# Patient Record
Sex: Female | Born: 2000 | Race: Black or African American | Hispanic: No | Marital: Single | Smoking: Never smoker
Health system: Southern US, Community
[De-identification: ages and names within clinical notes are randomized; demographics above are authoritative.]

## PROBLEM LIST (undated history)

## (undated) DIAGNOSIS — J45909 Unspecified asthma, uncomplicated: Secondary | ICD-10-CM

---

## 2020-12-05 ENCOUNTER — Other Ambulatory Visit: Payer: Self-pay

## 2020-12-05 ENCOUNTER — Ambulatory Visit (HOSPITAL_COMMUNITY)
Admission: EM | Admit: 2020-12-05 | Discharge: 2020-12-05 | Disposition: A | Discharge status: Home / Self Care | Payer: PRIVATE HEALTH INSURANCE | Attending: Family Medicine | Admitting: Family Medicine

## 2020-12-05 ENCOUNTER — Encounter (HOSPITAL_COMMUNITY): Payer: Self-pay

## 2020-12-05 DIAGNOSIS — Z113 Encounter for screening for infections with a predominantly sexual mode of transmission: Secondary | ICD-10-CM | POA: Insufficient documentation

## 2020-12-05 DIAGNOSIS — R110 Nausea: Secondary | ICD-10-CM | POA: Insufficient documentation

## 2020-12-05 HISTORY — DX: Unspecified asthma, uncomplicated: J45.909

## 2020-12-05 LAB — HIV ANTIBODY (ROUTINE TESTING W REFLEX): HIV Screen 4th Generation wRfx: NONREACTIVE

## 2020-12-05 LAB — POC URINE PREG, ED: Preg Test, Ur: NEGATIVE

## 2020-12-05 MED ORDER — ONDANSETRON 8 MG PO TBDP: 8.0000 mg | ORAL_TABLET | Freq: Three times a day (TID) | ORAL | 0 refills | Status: DC | PRN

## 2020-12-05 NOTE — ED Provider Notes (Signed)
  [facility]   MRN: [id number] DOB: 2001-07-23  Subjective:   Kara Neal is a 20 y.o. female presenting for 2 to 3-day history of intermittent nausea without vomiting, feeling off.  Patient wants to make sure that she does not have a sexually transmitted infection.  She is sexually active, uses condoms for protection.  Would like to make sure she gets pregnancy test.  Also wants HIV and syphilis testing.  Denies fever, belly pain, pelvic pain, vaginal discharge, urinary symptoms.  No genital rashes.  No current facility-administered medications for this encounter. No current outpatient medications on file.   No Known Allergies  Past Medical History:  Diagnosis Date  . Asthma      History reviewed. No pertinent surgical history.  Family History  Problem Relation Age of Onset  . Asthma Father     Social History   Tobacco Use  . Smoking status: Never Smoker  . Smokeless tobacco: Never Used  Substance Use Topics  . Alcohol use: Never  . Drug use: Never    ROS   Objective:   Vitals: BP 117/70   Pulse 74   Temp 98.3 F (36.8 C)   Resp 18   LMP  (LMP Unknown)   SpO2 100%   Physical Exam Constitutional:      General: She is not in acute distress.    Appearance: Normal appearance. She is well-developed. She is not ill-appearing.  HENT:     Head: Normocephalic and atraumatic.     Nose: Nose normal.     Mouth/Throat:     Mouth: Mucous membranes are moist.     Pharynx: Oropharynx is clear.  Eyes:     General: No scleral icterus.    Extraocular Movements: Extraocular movements intact.     Pupils: Pupils are equal, round, and reactive to light.  Cardiovascular:     Rate and Rhythm: Normal rate.  Pulmonary:     Effort: Pulmonary effort is normal.  Skin:    General: Skin is warm and dry.  Neurological:     General: No focal deficit present.     Mental Status: She is alert and oriented to person, place, and time.  Psychiatric:         Mood and Affect: Mood normal.        Behavior: Behavior normal.     Results for orders placed or performed during the hospital encounter of 12/05/20 (from the past 24 hour(s))  POC urine preg, ED (not at [facility])     Status: None   Collection Time: 12/05/20  3:18 PM  Result Value Ref Range   Preg Test, Ur NEGATIVE NEGATIVE    Assessment and Plan :   PDMP not reviewed this encounter.  1. Nausea without vomiting   2. Screen for STD (sexually transmitted disease)     Recommended supportive care with Zofran.  Encouraged patient to continue safe sex practices.  STI testing pending, will treat as appropriate. Counseled patient on potential for adverse effects with medications prescribed/recommended today, ER and return-to-clinic precautions discussed, patient verbalized understanding.    Wallis Bamberg, [place] 12/05/20 1554

## 2020-12-05 NOTE — ED Triage Notes (Signed)
Pt in requesting routine std testing and pregnancy test.  Pt c/o nausea.  Denies vomiting, vaginal discharge or irritation

## 2020-12-06 LAB — CERVICOVAGINAL ANCILLARY ONLY
Chlamydia: NEGATIVE
Comment: NEGATIVE
Comment: NEGATIVE
Comment: NORMAL
Neisseria Gonorrhea: NEGATIVE
Trichomonas: NEGATIVE

## 2020-12-06 LAB — RPR: RPR Ser Ql: NONREACTIVE

## 2021-02-23 ENCOUNTER — Encounter (HOSPITAL_COMMUNITY): Payer: Self-pay | Admitting: *Deleted

## 2021-02-23 ENCOUNTER — Other Ambulatory Visit: Payer: Self-pay

## 2021-02-23 ENCOUNTER — Ambulatory Visit (HOSPITAL_COMMUNITY)
Admission: EM | Admit: 2021-02-23 | Discharge: 2021-02-23 | Disposition: A | Discharge status: Home / Self Care | Payer: PRIVATE HEALTH INSURANCE | Attending: Internal Medicine | Admitting: Internal Medicine

## 2021-02-23 DIAGNOSIS — Z113 Encounter for screening for infections with a predominantly sexual mode of transmission: Secondary | ICD-10-CM

## 2021-02-23 DIAGNOSIS — N898 Other specified noninflammatory disorders of vagina: Secondary | ICD-10-CM | POA: Insufficient documentation

## 2021-02-23 DIAGNOSIS — Z3202 Encounter for pregnancy test, result negative: Secondary | ICD-10-CM | POA: Diagnosis not present

## 2021-02-23 LAB — POCT URINALYSIS DIPSTICK, ED / UC
Bilirubin Urine: NEGATIVE
Glucose, UA: NEGATIVE mg/dL
Hgb urine dipstick: NEGATIVE
Ketones, ur: NEGATIVE mg/dL
Leukocytes,Ua: NEGATIVE
Nitrite: NEGATIVE
Protein, ur: NEGATIVE mg/dL
Specific Gravity, Urine: 1.02 (ref 1.005–1.030)
Urobilinogen, UA: 0.2 mg/dL (ref 0.0–1.0)
pH: 7 (ref 5.0–8.0)

## 2021-02-23 LAB — POC URINE PREG, ED: Preg Test, Ur: NEGATIVE

## 2021-02-23 NOTE — Discharge Instructions (Addendum)
Your pregnancy test was negative.  Your urine did not show any signs of infection.   We will contact you with the results from your lab work and any additional treatment.    Do not have sex while taking undergoing treatment for STI.  Make sure that all of your partners get tested and treated.   Use a condom or other barrier method for all sexual encounters.    Return or go to the Emergency Department if symptoms worsen or do not improve in the next few days.

## 2021-02-23 NOTE — ED Triage Notes (Signed)
Pt reports Sx's started 2 weeks ago ,ABD pain , vag discharge.

## 2021-02-23 NOTE — ED Provider Notes (Signed)
[facility]    CSN: [id number] Arrival date & time: 02/23/21  1314      History   Chief Complaint Chief Complaint  Patient presents with  . Possible Pregnancy  . Exposure to STD    HPI Kara Neal is a 20 y.o. female.   Patient is here for evaluation of vaginal discharge, cramping, and missed period.  Reports developed some discharge and cramping approximately 2 weeks ago.  Denies any known exposure to STIs.  Denies any odor or vaginal itching.  Reports discharge is white.  Has not taken any OTC medications or treatments.  Denies any dysuria, urgency, frequency.  Denies any vaginal or pelvic pain. Denies any specific alleviating or aggravating factors.  Denies any fevers, chest pain, shortness of breath, N/V/D, numbness, tingling, weakness, abdominal pain, or headaches.   ROS: As per HPI, all other pertinent ROS negative   The history is provided by the patient.  Possible Pregnancy  Exposure to STD    Past Medical History:  Diagnosis Date  . Asthma     There are no problems to display for this patient.   History reviewed. No pertinent surgical history.  OB History   No obstetric history on file.      Home Medications    Prior to Admission medications   Medication Sig Start Date End Date Taking? Authorizing Provider  ondansetron (ZOFRAN-ODT) 8 MG disintegrating tablet Take 1 tablet (8 mg total) by mouth every 8 (eight) hours as needed for nausea or vomiting. 12/05/20   Wallis Bamberg, PA-C    Family History Family History  Problem Relation Age of Onset  . Asthma Father     Social History Social History   Tobacco Use  . Smoking status: Never Smoker  . Smokeless tobacco: Never Used  Substance Use Topics  . Alcohol use: Never  . Drug use: Never     Allergies   Patient has no known allergies.   Review of Systems Review of Systems  Genitourinary: Positive for menstrual problem and vaginal discharge. Negative for pelvic pain and vaginal  pain.     Physical Exam Triage Vital Signs ED Triage Vitals  Enc Vitals Group     BP 02/23/21 1402 110/60     Pulse Rate 02/23/21 1402 72     Resp 02/23/21 1402 16     Temp 02/23/21 1402 98.2 F (36.8 C)     Temp Source 02/23/21 1402 Oral     SpO2 02/23/21 1402 99 %     Weight --      Height --      Head Circumference --      Peak Flow --      Pain Score 02/23/21 1357 4     Pain Loc --      Pain Edu? --      Excl. in GC? --    No data found.  Updated Vital Signs BP 110/60 (BP Location: Left Arm)   Pulse 72   Temp 98.2 F (36.8 C) (Oral)   Resp 16   LMP 01/23/2021   SpO2 99%   Visual Acuity Right Eye Distance:   Left Eye Distance:   Bilateral Distance:    Right Eye Near:   Left Eye Near:    Bilateral Near:     Physical Exam Vitals and nursing note reviewed.  Constitutional:      General: She is not in acute distress.    Appearance: Normal appearance. She is not ill-appearing, toxic-appearing  or diaphoretic.  HENT:     Head: Normocephalic and atraumatic.  Eyes:     Conjunctiva/sclera: Conjunctivae normal.  Cardiovascular:     Rate and Rhythm: Normal rate.     Pulses: Normal pulses.  Pulmonary:     Effort: Pulmonary effort is normal.  Abdominal:     General: Abdomen is flat.  Genitourinary:    Comments: declines Musculoskeletal:        General: Normal range of motion.     Cervical back: Normal range of motion.  Skin:    General: Skin is warm and dry.  Neurological:     General: No focal deficit present.     Mental Status: She is alert and oriented to person, place, and time.  Psychiatric:        Mood and Affect: Mood normal.      UC Treatments / Results  Labs (all labs ordered are listed, but only abnormal results are displayed) Labs Reviewed  POCT URINALYSIS DIPSTICK, ED / UC  POC URINE PREG, ED  CERVICOVAGINAL ANCILLARY ONLY    EKG   Radiology No results found.  Procedures Procedures (including critical care  time)  Medications Ordered in UC Medications - No data to display  Initial Impression / Assessment and Plan / UC Course  I have reviewed the triage vital signs and the nursing notes.  Pertinent labs & imaging results that were available during my care of the patient were reviewed by me and considered in my medical decision making (see chart for details).     Assessment negative for red flags or concerns. Self swab obtained.  Will treat based on results. Urinalysis negative for signs of infection, urine pregnancy negative. Safe sex practices discussed including condoms or other barrier methods. Follow-up with primary care as needed  Final Clinical Impressions(s) / UC Diagnoses   Final diagnoses:  Screen for STD (sexually transmitted disease)  Vaginal discharge  Negative pregnancy test     Discharge Instructions     Your pregnancy test was negative.  Your urine did not show any signs of infection.   We will contact you with the results from your lab work and any additional treatment.    Do not have sex while taking undergoing treatment for STI.  Make sure that all of your partners get tested and treated.   Use a condom or other barrier method for all sexual encounters.    Return or go to the Emergency Department if symptoms worsen or do not improve in the next few days.     ED Prescriptions    None     PDMP not reviewed this encounter.   Ivette Loyal, NP 02/23/21 1429

## 2021-02-24 ENCOUNTER — Telehealth (HOSPITAL_COMMUNITY): Payer: Self-pay | Admitting: Emergency Medicine

## 2021-02-24 LAB — CERVICOVAGINAL ANCILLARY ONLY
Bacterial Vaginitis (gardnerella): POSITIVE — AB
Candida Glabrata: NEGATIVE
Candida Vaginitis: NEGATIVE
Chlamydia: NEGATIVE
Comment: NEGATIVE
Comment: NEGATIVE
Comment: NEGATIVE
Comment: NEGATIVE
Comment: NEGATIVE
Comment: NORMAL
Neisseria Gonorrhea: NEGATIVE
Trichomonas: NEGATIVE

## 2021-02-24 MED ORDER — METRONIDAZOLE 500 MG PO TABS: 500.0000 mg | ORAL_TABLET | Freq: Two times a day (BID) | ORAL | 0 refills | Status: DC

## 2021-04-27 ENCOUNTER — Other Ambulatory Visit: Payer: Self-pay

## 2021-04-27 ENCOUNTER — Emergency Department (HOSPITAL_COMMUNITY)
Admission: EM | Admit: 2021-04-27 | Discharge: 2021-04-27 | Disposition: A | Discharge status: Home / Self Care | Payer: PRIVATE HEALTH INSURANCE | Attending: Emergency Medicine | Admitting: Emergency Medicine

## 2021-04-27 ENCOUNTER — Encounter (HOSPITAL_COMMUNITY): Payer: Self-pay | Admitting: Emergency Medicine

## 2021-04-27 ENCOUNTER — Emergency Department (HOSPITAL_COMMUNITY): Payer: PRIVATE HEALTH INSURANCE

## 2021-04-27 DIAGNOSIS — R059 Cough, unspecified: Secondary | ICD-10-CM | POA: Diagnosis present

## 2021-04-27 DIAGNOSIS — J4521 Mild intermittent asthma with (acute) exacerbation: Secondary | ICD-10-CM | POA: Diagnosis not present

## 2021-04-27 DIAGNOSIS — B9689 Other specified bacterial agents as the cause of diseases classified elsewhere: Secondary | ICD-10-CM

## 2021-04-27 DIAGNOSIS — Z113 Encounter for screening for infections with a predominantly sexual mode of transmission: Secondary | ICD-10-CM | POA: Diagnosis not present

## 2021-04-27 DIAGNOSIS — N760 Acute vaginitis: Secondary | ICD-10-CM

## 2021-04-27 DIAGNOSIS — Z20822 Contact with and (suspected) exposure to covid-19: Secondary | ICD-10-CM | POA: Diagnosis not present

## 2021-04-27 DIAGNOSIS — R079 Chest pain, unspecified: Secondary | ICD-10-CM

## 2021-04-27 DIAGNOSIS — B373 Candidiasis of vulva and vagina: Secondary | ICD-10-CM

## 2021-04-27 DIAGNOSIS — M25571 Pain in right ankle and joints of right foot: Secondary | ICD-10-CM | POA: Insufficient documentation

## 2021-04-27 DIAGNOSIS — G8929 Other chronic pain: Secondary | ICD-10-CM | POA: Diagnosis not present

## 2021-04-27 DIAGNOSIS — B3731 Acute candidiasis of vulva and vagina: Secondary | ICD-10-CM

## 2021-04-27 LAB — CBC WITH DIFFERENTIAL/PLATELET
Abs Immature Granulocytes: 0.05 10*3/uL (ref 0.00–0.07)
Basophils Absolute: 0.1 10*3/uL (ref 0.0–0.1)
Basophils Relative: 1 %
Eosinophils Absolute: 0.2 10*3/uL (ref 0.0–0.5)
Eosinophils Relative: 2 %
HCT: 39.9 % (ref 36.0–46.0)
Hemoglobin: 13.1 g/dL (ref 12.0–15.0)
Immature Granulocytes: 1 %
Lymphocytes Relative: 35 %
Lymphs Abs: 3.4 10*3/uL (ref 0.7–4.0)
MCH: 31 pg (ref 26.0–34.0)
MCHC: 32.8 g/dL (ref 30.0–36.0)
MCV: 94.3 fL (ref 80.0–100.0)
Monocytes Absolute: 0.7 10*3/uL (ref 0.1–1.0)
Monocytes Relative: 7 %
Neutro Abs: 5.6 10*3/uL (ref 1.7–7.7)
Neutrophils Relative %: 54 %
Platelets: 289 10*3/uL (ref 150–400)
RBC: 4.23 MIL/uL (ref 3.87–5.11)
RDW: 12.1 % (ref 11.5–15.5)
WBC: 10 10*3/uL (ref 4.0–10.5)
nRBC: 0 % (ref 0.0–0.2)

## 2021-04-27 LAB — BASIC METABOLIC PANEL
Anion gap: 7 (ref 5–15)
BUN: 6 mg/dL (ref 6–20)
CO2: 25 mmol/L (ref 22–32)
Calcium: 8.9 mg/dL (ref 8.9–10.3)
Chloride: 107 mmol/L (ref 98–111)
Creatinine, Ser: 0.59 mg/dL (ref 0.44–1.00)
GFR, Estimated: >60 mL/min (ref >60)
Glucose, Bld: 98 mg/dL (ref 70–99)
Potassium: 3.3 mmol/L — ABNORMAL LOW (ref 3.5–5.1)
Sodium: 139 mmol/L (ref 135–145)

## 2021-04-27 LAB — RESP PANEL BY RT-PCR (FLU A&B, COVID) ARPGX2
Influenza A by PCR: NEGATIVE
Influenza B by PCR: NEGATIVE
SARS Coronavirus 2 by RT PCR: NEGATIVE

## 2021-04-27 LAB — WET PREP, GENITAL
Sperm: NONE SEEN
Trich, Wet Prep: NONE SEEN

## 2021-04-27 LAB — TROPONIN I (HIGH SENSITIVITY): Troponin I (High Sensitivity): <2 ng/L (ref <18)

## 2021-04-27 LAB — I-STAT BETA HCG BLOOD, ED (MC, WL, AP ONLY): I-stat hCG, quantitative: <5 m[IU]/mL (ref <5)

## 2021-04-27 MED ORDER — LIDOCAINE HCL (PF) 1 % IJ SOLN
INTRAMUSCULAR | Status: AC
  Administered 2021-04-27: 1 mL
  Filled 2021-04-27: qty 5

## 2021-04-27 MED ORDER — AZITHROMYCIN 250 MG PO TABS
1000.0000 mg | ORAL_TABLET | Freq: Once | ORAL | Status: AC
  Administered 2021-04-27: 1000 mg via ORAL
  Filled 2021-04-27: qty 4

## 2021-04-27 MED ORDER — CEFTRIAXONE SODIUM 500 MG IJ SOLR
500.0000 mg | Freq: Once | INTRAMUSCULAR | Status: AC
  Administered 2021-04-27: 500 mg via INTRAMUSCULAR
  Filled 2021-04-27: qty 500

## 2021-04-27 MED ORDER — METRONIDAZOLE 500 MG PO TABS: 500.0000 mg | ORAL_TABLET | Freq: Two times a day (BID) | ORAL | 0 refills | Status: DC

## 2021-04-27 MED ORDER — FLUCONAZOLE 200 MG PO TABS: 200.0000 mg | ORAL_TABLET | Freq: Every day | ORAL | 0 refills | Status: AC

## 2021-04-27 NOTE — Discharge Instructions (Signed)
You had a mild asthma flare today - symptoms resolved.  For the next 2 days, use your neb machine morning and night and inhalers every 6 hours.  Start taking an allergy medicine daily. You can try over the counter delsym to help with cough.  Return for worsening symptoms, chest pain, shortness of breath, fever, productive cough  X-ray of right ankle was normal.  Ice. Use ibuprofen or acetaminophen. You can purchase an over the counter ankle brace for support. Return for joint swelling, redness, warmth, fevers, calf pain or swelling.   STD testing is pending - check Mychart for results.  You were treated for possible gonorrhea and chlamydia given possible exposure.  Return for abdominal or pelvic pain, worsening discharge, fevers. Go to the health department for future STD testing

## 2021-04-27 NOTE — ED Provider Notes (Signed)
[facility] EMERGENCY DEPARTMENT Provider Note   CSN: [id number] Arrival date & time: 04/27/21  3295     History Chief Complaint  Patient presents with  . Asthma  . Ankle Pain    Kara Neal is a 20 y.o. female presents to ED from work by EMS for evaluation of "asthma". Also reports right ankle pain. Also requesting STD testing. Patient is on the phone having an argument during encounter.   Reports 2-3 days of a slight dry cough. Thought it was allergies.  She went to work and developed chest tightness, wheezing, worsening cough. Symptoms are similar to previous flares of asthma. She received albuterol x 1, duoneb x 1 and 125 mg solumedrol by EMS and reports significant improvement. Denies fever, leg swelling, calf pain, history of clots.  She uses albuterol and advair inhalers, nebulizer treatments at home.  Denies runny nose, sore throat.   Reports 1 month of "pressure" type pain on the outside of her right ankle and bottom of her foot.  Worse with walking. No injury. Denies swelling, redness, warmth, distal tingling or numbness, calf pain or swelling.   Also requesting STD testing.  Denies abdominal or pelvic pain. Usual vaginal discharge unchanged. No genital lesions. She is concerned about exposure.  HPI     Past Medical History:  Diagnosis Date  . Asthma     There are no problems to display for this patient.   History reviewed. No pertinent surgical history.   OB History   No obstetric history on file.     Family History  Problem Relation Age of Onset  . Asthma Father     Social History   Tobacco Use  . Smoking status: Never Smoker  . Smokeless tobacco: Never Used  Substance Use Topics  . Alcohol use: Never  . Drug use: Never    Home Medications Prior to Admission medications   Medication Sig Start Date End Date Taking? Authorizing Provider  metroNIDAZOLE (FLAGYL) 500 MG tablet Take 1 tablet (500 mg total) by mouth 2 (two) times  daily. Patient not taking: No sig reported 02/24/21   Merrilee Jansky, MD  ondansetron (ZOFRAN-ODT) 8 MG disintegrating tablet Take 1 tablet (8 mg total) by mouth every 8 (eight) hours as needed for nausea or vomiting. Patient not taking: No sig reported 12/05/20   Wallis Bamberg, PA-C    Allergies    Patient has no known allergies.  Review of Systems   Review of Systems  Respiratory: Positive for cough, chest tightness, shortness of breath and wheezing.   Musculoskeletal: Positive for arthralgias.  All other systems reviewed and are negative.   Physical Exam Updated Vital Signs BP 116/79   Pulse 86   Temp (!) 97.5 F (36.4 C) (Oral)   Resp (!) 22   LMP 04/01/2021   SpO2 100%   Physical Exam Vitals and nursing note reviewed.  Constitutional:      General: She is not in acute distress.    Appearance: She is well-developed.     Comments: NAD.  HENT:     Head: Normocephalic and atraumatic.     Right Ear: External ear normal.     Left Ear: External ear normal.     Nose: Nose normal.  Eyes:     General: No scleral icterus.    Conjunctiva/sclera: Conjunctivae normal.  Cardiovascular:     Rate and Rhythm: Normal rate and regular rhythm.     Heart sounds: Normal heart sounds. No murmur  heard.     Comments: No LE edema. No calf tenderness  Pulmonary:     Effort: Pulmonary effort is normal.     Breath sounds: Normal breath sounds. No wheezing.     Comments: Faint expiratory wheezing upper lobes. Normal work of breathing. SpO2 100% during encounter  Abdominal:     Palpations: Abdomen is soft.     Tenderness: There is no abdominal tenderness.  Musculoskeletal:        General: No deformity. Normal range of motion.     Cervical back: Normal range of motion and neck supple.     Comments: No right ankle, foot, calf edema. TTP right ATFL. +Talar tilt. No TTP at medial ankle, calcaneous, achilles.  Normal skin without erythema, warmth, edema. 1+ DP and PT pulses.  Sensation and  strength in right lower extremity intact.   Skin:    General: Skin is warm and dry.     Capillary Refill: Capillary refill takes less than 2 seconds.  Neurological:     Mental Status: She is alert and oriented to person, place, and time.  Psychiatric:        Behavior: Behavior normal.        Thought Content: Thought content normal.        Judgment: Judgment normal.     ED Results / Procedures / Treatments   Labs (all labs ordered are listed, but only abnormal results are displayed) Labs Reviewed  BASIC METABOLIC PANEL - Abnormal; Notable for the following components:      Result Value   Potassium 3.3 (*)    All other components within normal limits  RESP PANEL BY RT-PCR (FLU A&B, COVID) ARPGX2  WET PREP, GENITAL  CBC WITH DIFFERENTIAL/PLATELET  I-STAT BETA HCG BLOOD, ED ([facility], [facility], [facility] ONLY)  GC/CHLAMYDIA PROBE AMP ([place]) NOT AT [facility] [place]  TROPONIN I (HIGH SENSITIVITY)    EKG EKG Interpretation  Date/Time:  Sunday April 27 2021 08:44:13 EDT Ventricular Rate:  78 PR Interval:  136 QRS Duration: 78 QT Interval:  360 QTC Calculation: 410 R Axis:   73 Text Interpretation: Normal sinus rhythm Normal ECG Confirmed by Kristine Royal [phone]) on 04/27/2021 12:29:32 PM   Radiology DG Chest 1 View  Result Date: 04/27/2021 CLINICAL DATA:  Chest pain EXAM: CHEST  1 VIEW COMPARISON:  None. FINDINGS: Lungs are clear. Heart size and pulmonary vascularity are normal. No adenopathy. No pneumothorax. No bone lesions. IMPRESSION: Lungs clear.  Cardiac silhouette normal. Electronically Signed   By: Bretta Bang III M.D.   On: 04/27/2021 09:28   DG Ankle Complete Right  Result Date: 04/27/2021 CLINICAL DATA:  Pain EXAM: RIGHT ANKLE - COMPLETE 3+ VIEW COMPARISON:  None. FINDINGS: Frontal, oblique, and lateral views were obtained. There is no evident fracture or joint effusion. Joint spaces appear normal. Small spur along the dorsal proximal navicular. No erosions. Ankle mortise appears  intact. IMPRESSION: No evident fracture or arthropathy.  Ankle mortise appears intact. Electronically Signed   By: Bretta Bang III M.D.   On: 04/27/2021 09:28    Procedures Procedures   Medications Ordered in ED Medications  cefTRIAXone (ROCEPHIN) injection 500 mg (500 mg Intramuscular Given 04/27/21 1237)  azithromycin (ZITHROMAX) tablet 1,000 mg (1,000 mg Oral Given 04/27/21 1236)  lidocaine (PF) (XYLOCAINE) 1 % injection (1 mL  Given 04/27/21 1238)    ED Course  I have reviewed the triage vital signs and the nursing notes.  Pertinent labs & imaging results that were available during  my care of the patient were reviewed by me and considered in my medical decision making (see chart for details).    MDM Rules/Calculators/A&P                           20 y.o. yo female presents to the ED for several complaints including chest pain, shortness of breath, cough, wheezing consistent with usual asthma flares. Right ankle pain for 1 month - requesting x-ray. STD screening, asymptomatic.  Additional information obtained from chart, nursing and triage notes review  Chart review reveals - 2 previous ED visits for STD screening.  Ordered lab, imaging were personally reviewed and interpreted  Labs reveal - unremarkable. Trop x 1 undetectable. RVP negative. GC/chlamydia probe pending. Wet prep without BV or yeast or trich.   Imaging reveals - CXR without acute findigns. Right ankle x-ray unremarkable. EKG without ischemic changes, RV strain pattern.    Medicines ordered - empiric treatment for STD given exposure - azithromycin, rocephin.   ED course & MDM 1230: I triaged and also evaluated patient in main ED. Lung exam x 2 shows faint wheezing. Normal SpO2 on room air. Patient feels better after breathing treatment, solumedrol by EMS.  Given reassuring work up, clinical response, suspect URI vs seasonal changes causing mild asthma flare.  Will recommend scheduled breathing treatments at  home, antihistamine, antitussive.  She had large dose of steroids IV I think we can defer prednisone taper. No indication for antibiotics.    Suspect soft tissue/overuse injury of right ankle. No symptoms to suggest cellulitis, septic arthritis, gout, DVT. Will recommend RICE, NSAID PCP follow up.   Asymptomatic STD screening requested. Since she has no symptoms, abdominal or pelvic pain, fever, she self swabbed. I think it is reasonable to defer pelvic and bimanual exam.  Empirically treated with rocephin/azithro given possible exposure. Recommended HD for future STD screening. She has no symptoms of pelvic infection, PID, TOA. Patient didn't want to wait for wet prep and requested discharge. Gc/chlamydia and wet prep pending.   Portions of this note were generated with Scientist, clinical (histocompatibility and immunogenetics). Dictation errors may occur despite best attempts at proofreading    Final Clinical Impression(s) / ED Diagnoses Final diagnoses:  Chronic pain of right ankle  Mild intermittent asthma with exacerbation  Screening for STD (sexually transmitted disease)    Rx / DC Orders ED Discharge Orders    None       Jerrell Mylar 04/27/21 1243    Wynetta Fines, MD 04/27/21 1942

## 2021-04-27 NOTE — ED Triage Notes (Signed)
Pt to triage via [facility] from work.  Pt woke up this morning with asthma flare-up.  Doesn't have a rescue inhaler.  EMS administered Albuterol x 1, DuoNeb x 1, and Solumedrol 125 mg. IV-20 g LAC.  Pt states she is feeling better.  Lung sounds improved after treatments.  Still c/o chest tightness to center of chest.  Also requesting R ankle to be x-rayed.  R ankle pain x 1 month with no known injury.

## 2021-04-27 NOTE — ED Notes (Signed)
RN reviewed discharge instructions w/ pt. Follow up, use of nebs, inhalers and allergy meds reviewed. Pain management reviewed. Pt had no further questions

## 2021-04-27 NOTE — ED Provider Notes (Signed)
1315: Wet prep shows yeast and clue cells. Rx sent to pharmacy on record. Patient has left prior to wet prep results.    Liberty Handy, PA-C 04/27/21 1312    Wynetta Fines, MD 04/27/21 [phone]

## 2021-04-27 NOTE — ED Provider Notes (Signed)
Emergency Medicine Provider Triage Evaluation Note  Kara Neal , a 20 y.o. female  was evaluated in triage.  Pt complains of CP central described as tight sharp onset at work with wheezing, coughing, SOB. History of asthma. Received meds by EMS feels a little better. Also wants x-ray of right ankle bc having pain for 1 month. No injury.  No leg swelling or calf pain.  Review of Systems  Positive: CP SOB wheezing cough ankle pain Negative: Fever leg swelling   Physical Exam  BP 132/81 (BP Location: Right Arm)   Pulse 82   Temp (!) 97.5 F (36.4 C) (Oral)   Resp 16   LMP 04/01/2021   SpO2 100%  Gen:   Awake, no distress   Resp:  Normal effort  MSK:   Moves extremities without difficulty. No ankle edema Cards:  RRR no LE edema or calf tenderness    Medical Decision Making  Medically screening exam initiated at 8:58 AM.  Appropriate orders placed.     Patient made aware this encounter is a triage and screening encounter and no beds are immediately available at this time in the ER.  Patient was informed that the remainder of the evaluation will be completed by another provider.  Patient made aware triage orders have been placed and patient will be placed in the waiting room while work up is initiated and until a room becomes available. Patient encouraged to await a formal ER encounter with a clinician.  Patient made aware that exiting the department prior to formal encounter with an ER clinician and completion of the work-up is considered leaving against medical advice.  At that time there is no guarantee that there are no emergency medical conditions present and patient assumes risks of leaving including worsening condition, permanent disability and death. Patient verbalizes understanding.     Liberty Handy, PA-C 04/27/21 0900    Derwood Kaplan, MD 04/28/21 [phone]

## 2021-04-28 LAB — GC/CHLAMYDIA PROBE AMP (~~LOC~~) NOT AT ARMC
Chlamydia: NEGATIVE
Comment: NEGATIVE
Comment: NORMAL
Neisseria Gonorrhea: NEGATIVE

## 2021-12-24 ENCOUNTER — Ambulatory Visit (HOSPITAL_COMMUNITY)
Admission: EM | Admit: 2021-12-24 | Discharge: 2021-12-24 | Disposition: A | Discharge status: Home / Self Care | Payer: Self-pay | Attending: Emergency Medicine | Admitting: Emergency Medicine

## 2021-12-24 ENCOUNTER — Encounter (HOSPITAL_COMMUNITY): Payer: Self-pay | Admitting: *Deleted

## 2021-12-24 ENCOUNTER — Other Ambulatory Visit: Payer: Self-pay

## 2021-12-24 DIAGNOSIS — N898 Other specified noninflammatory disorders of vagina: Secondary | ICD-10-CM

## 2021-12-24 LAB — POC URINE PREG, ED: Preg Test, Ur: NEGATIVE

## 2021-12-24 LAB — HIV ANTIBODY (ROUTINE TESTING W REFLEX): HIV Screen 4th Generation wRfx: NONREACTIVE

## 2021-12-24 NOTE — ED Provider Notes (Signed)
[facility]    CSN: [id number] Arrival date & time: 12/24/21  1044      History   Chief Complaint Chief Complaint  Patient presents with   Vaginal Discharge    HPI Kara Neal is a 21 y.o. female.  She reports abnormal vaginal discharge for a week.  Describes discharge as watery and yellow.  Reports lower abdominal pain, like a cramping pain on and off for the last week.  Uses the implant for birth control.  Does not use condoms when she has vaginal intercourse.  Because of the implant, patient does not get regular periods; last menstrual period was 2 months ago.   Vaginal Discharge Associated symptoms: no dysuria and no fever    Past Medical History:  Diagnosis Date   Asthma     There are no problems to display for this patient.   History reviewed. No pertinent surgical history.  OB History   No obstetric history on file.      Home Medications    Prior to Admission medications   Medication Sig Start Date End Date Taking? Authorizing Provider  metroNIDAZOLE (FLAGYL) 500 MG tablet Take 1 tablet (500 mg total) by mouth 2 (two) times daily. 04/27/21   Liberty Handy, PA-C  ondansetron (ZOFRAN-ODT) 8 MG disintegrating tablet Take 1 tablet (8 mg total) by mouth every 8 (eight) hours as needed for nausea or vomiting. Patient not taking: No sig reported 12/05/20   Wallis Bamberg, PA-C    Family History Family History  Problem Relation Age of Onset   Asthma Father     Social History Social History   Tobacco Use   Smoking status: Never   Smokeless tobacco: Never  Substance Use Topics   Alcohol use: Never   Drug use: Never     Allergies   Patient has no known allergies.   Review of Systems Review of Systems  Constitutional:  Negative for chills and fever.  Genitourinary:  Positive for vaginal discharge. Negative for dysuria, flank pain and vaginal bleeding.    Physical Exam Triage Vital Signs ED Triage Vitals  Enc Vitals Group     BP  --      Pulse Rate 12/24/21 1156 (P) 84     Resp 12/24/21 1156 (P) 18     Temp 12/24/21 1156 (P) 97.6 F (36.4 C)     Temp src --      SpO2 12/24/21 1156 (P) 97 %     Weight --      Height --      Head Circumference --      Peak Flow --      Pain Score 12/24/21 1152 0     Pain Loc --      Pain Edu? --      Excl. in GC? --    No data found.  Updated Vital Signs Pulse (P) 84    Temp (P) 97.6 F (36.4 C)    Resp (P) 18    LMP 09/23/2021 Comment: PT taking birth control   SpO2 (P) 97%   Visual Acuity Right Eye Distance:   Left Eye Distance:   Bilateral Distance:    Right Eye Near:   Left Eye Near:    Bilateral Near:     Physical Exam Constitutional:      General: She is not in acute distress.    Appearance: Normal appearance. She is not ill-appearing.  Cardiovascular:     Rate and Rhythm: Normal rate  and regular rhythm.  Pulmonary:     Effort: Pulmonary effort is normal.     Breath sounds: Normal breath sounds.  Abdominal:     General: Abdomen is flat. Bowel sounds are normal. There is no distension.     Palpations: Abdomen is soft.     Tenderness: There is generalized abdominal tenderness. There is no right CVA tenderness, guarding or rebound.  Neurological:     Mental Status: She is alert.     UC Treatments / Results  Labs (all labs ordered are listed, but only abnormal results are displayed) Labs Reviewed  HIV ANTIBODY (ROUTINE TESTING W REFLEX)  RPR  POC URINE PREG, ED  CERVICOVAGINAL ANCILLARY ONLY    EKG   Radiology No results found.  Procedures Procedures (including critical care time)  Medications Ordered in UC Medications - No data to display  Initial Impression / Assessment and Plan / UC Course  I have reviewed the triage vital signs and the nursing notes.  Pertinent labs & imaging results that were available during my care of the patient were reviewed by me and considered in my medical decision making (see chart for details).     Pregnancy test negative.  All other tests pending.  Final Clinical Impressions(s) / UC Diagnoses   Final diagnoses:  Vaginal discharge     Discharge Instructions      You will get a call if tests are positive, you will not get a call if tests are negative but you can check results in MyChart if you have a MyChart account.     ED Prescriptions   None    PDMP not reviewed this encounter.   Cathlyn Parsons, NP 12/24/21 1326

## 2021-12-24 NOTE — Discharge Instructions (Signed)
You will get a call if tests are positive, you will not get a call if tests are negative but you can check results in MyChart if you have a MyChart account.   

## 2021-12-24 NOTE — ED Triage Notes (Signed)
Pt reports a vaginal discharge that is not normal.

## 2021-12-25 LAB — CERVICOVAGINAL ANCILLARY ONLY
Bacterial Vaginitis (gardnerella): POSITIVE — AB
Candida Glabrata: NEGATIVE
Candida Vaginitis: NEGATIVE
Chlamydia: NEGATIVE
Comment: NEGATIVE
Comment: NEGATIVE
Comment: NEGATIVE
Comment: NEGATIVE
Comment: NEGATIVE
Comment: NORMAL
Neisseria Gonorrhea: NEGATIVE
Trichomonas: NEGATIVE

## 2021-12-25 LAB — RPR: RPR Ser Ql: NONREACTIVE

## 2021-12-26 ENCOUNTER — Telehealth (HOSPITAL_COMMUNITY): Payer: Self-pay | Admitting: Emergency Medicine

## 2021-12-26 MED ORDER — METRONIDAZOLE 500 MG PO TABS: 500.0000 mg | ORAL_TABLET | Freq: Two times a day (BID) | ORAL | 0 refills | Status: DC

## 2022-02-05 ENCOUNTER — Ambulatory Visit (HOSPITAL_COMMUNITY)
Admission: EM | Admit: 2022-02-05 | Discharge: 2022-02-05 | Disposition: A | Discharge status: Home / Self Care | Payer: Self-pay | Attending: Physician Assistant | Admitting: Physician Assistant

## 2022-02-05 ENCOUNTER — Other Ambulatory Visit: Payer: Self-pay

## 2022-02-05 ENCOUNTER — Encounter (HOSPITAL_COMMUNITY): Payer: Self-pay | Admitting: *Deleted

## 2022-02-05 DIAGNOSIS — J40 Bronchitis, not specified as acute or chronic: Secondary | ICD-10-CM | POA: Insufficient documentation

## 2022-02-05 DIAGNOSIS — N898 Other specified noninflammatory disorders of vagina: Secondary | ICD-10-CM | POA: Insufficient documentation

## 2022-02-05 DIAGNOSIS — J329 Chronic sinusitis, unspecified: Secondary | ICD-10-CM | POA: Insufficient documentation

## 2022-02-05 DIAGNOSIS — J4521 Mild intermittent asthma with (acute) exacerbation: Secondary | ICD-10-CM | POA: Insufficient documentation

## 2022-02-05 MED ORDER — ALBUTEROL SULFATE HFA 108 (90 BASE) MCG/ACT IN AERS: 1.0000 | INHALATION_SPRAY | Freq: Four times a day (QID) | RESPIRATORY_TRACT | 0 refills | Status: DC | PRN

## 2022-02-05 MED ORDER — AMOXICILLIN-POT CLAVULANATE 875-125 MG PO TABS: 1.0000 | ORAL_TABLET | Freq: Two times a day (BID) | ORAL | 0 refills | Status: DC

## 2022-02-05 MED ORDER — PREDNISONE 20 MG PO TABS: 40.0000 mg | ORAL_TABLET | Freq: Every day | ORAL | 0 refills | Status: AC

## 2022-02-05 MED ORDER — ALBUTEROL SULFATE (2.5 MG/3ML) 0.083% IN NEBU: 2.5000 mg | INHALATION_SOLUTION | Freq: Four times a day (QID) | RESPIRATORY_TRACT | 0 refills | Status: DC | PRN

## 2022-02-05 NOTE — Discharge Instructions (Signed)
Start Augmentin twice daily to cover for infection.  Take prednisone 40 mg for 4 days.  Do not take NSAIDs including aspirin, ibuprofen/Advil, naproxen/Aleve with this medication.  Use Mucinex, Tylenol, Flonase for symptom relief.  I have sent in refills of your albuterol so please use these to manage her shortness of breath symptoms.  If symptoms are not improving or if anything worsens you need to return for reevaluation.  If anything becomes severe including chest pain, shortness of breath, worsening cough you should be seen immediately. ? ?We will contact you if we need to arrange any treatment based on your swab result.  Please abstain from sex activity until results are obtained.  If you develop any pelvic pain, abdominal pain, fever, nausea, vomiting please return for reevaluation. ?

## 2022-02-05 NOTE — ED Triage Notes (Signed)
Pt reports Vag discharge. Pt reports she has been hoarse for one week. ?

## 2022-02-05 NOTE — ED Provider Notes (Signed)
?[facility] ? ? ? ?CSN: [id number] ?Arrival date & time: 02/05/22  1220 ? ? ?  ? ?History   ?Chief Complaint ?Chief Complaint  ?Patient presents with  ? SEXUALLY TRANSMITTED DISEASE  ? Hoarse  ? Sore Throat  ? ? ?HPI ?Kara Neal is a 21 y.o. female.  ? ?Patient presents today with several concerns.  She reports a 2-week history of cough with associated sore throat and soreness.  She denies any fever, chest pain, shortness of breath.  She does have a history of asthma and has been using albuterol more frequently; requesting refill of nebulizer solution as well as inhaler to have on hand.  She denies any recent antibiotic or steroid use.  Denies any known sick contacts.  She is up-to-date on age-appropriate immunizations.  She has not been using any over-the-counter medication for symptom management.  Reports symptoms have been worsening prompting evaluation today. ? ?In addition, patient reports a 1 week history of vaginal discharge.  She describes this as copious, malodorous, yellow.  She is sexually active.  Denies any specific exposures.  Declined HIV/hepatitis/syphilis testing.  She was seen and treated for bacterial vaginosis in February 2023 and reports symptoms resolved with metronidazole.  She does report changing her soap but denies additional changes to personal hygiene products.  Denies any recent medication changes.  She is confident that she is not pregnant. ? ? ?Past Medical History:  ?Diagnosis Date  ? Asthma   ? ? ?There are no problems to display for this patient. ? ? ?History reviewed. No pertinent surgical history. ? ?OB History   ?No obstetric history on file. ?  ? ? ? ?Home Medications   ? ?Prior to Admission medications   ?Medication Sig Start Date End Date Taking? Authorizing Provider  ?albuterol (PROVENTIL) (2.5 MG/3ML) 0.083% nebulizer solution Take 3 mLs (2.5 mg total) by nebulization every 6 (six) hours as needed for wheezing or shortness of breath. 02/05/22  Yes Kiylee Thoreson  K, PA-C  ?albuterol (VENTOLIN HFA) 108 (90 Base) MCG/ACT inhaler Inhale 1-2 puffs into the lungs every 6 (six) hours as needed for wheezing or shortness of breath. 02/05/22  Yes Amalia Edgecombe K, PA-C  ?amoxicillin-clavulanate (AUGMENTIN) 875-125 MG tablet Take 1 tablet by mouth every 12 (twelve) hours. 02/05/22  Yes Talea Manges K, PA-C  ?predniSONE (DELTASONE) 20 MG tablet Take 2 tablets (40 mg total) by mouth daily for 4 days. 02/05/22 02/09/22 Yes Lennex Pietila, Noberto Retort, PA-C  ? ? ?Family History ?Family History  ?Problem Relation Age of Onset  ? Asthma Father   ? ? ?Social History ?Social History  ? ?Tobacco Use  ? Smoking status: Never  ? Smokeless tobacco: Never  ?Substance Use Topics  ? Alcohol use: Never  ? Drug use: Never  ? ? ? ?Allergies   ?Patient has no known allergies. ? ? ?Review of Systems ?Review of Systems  ?Constitutional:  Positive for activity change. Negative for appetite change, fatigue and fever.  ?HENT:  Positive for congestion, sore throat and voice change. Negative for sinus pressure and sneezing.   ?Respiratory:  Positive for cough and chest tightness. Negative for shortness of breath.   ?Cardiovascular:  Negative for chest pain.  ?Gastrointestinal:  Negative for abdominal pain, diarrhea, nausea and vomiting.  ?Genitourinary:  Positive for vaginal discharge. Negative for vaginal bleeding and vaginal pain.  ?Neurological:  Negative for dizziness, light-headedness and headaches.  ? ? ?Physical Exam ?Triage Vital Signs ?ED Triage Vitals  ?Enc Vitals Group  ?  BP 02/05/22 1345 108/73  ?   Pulse Rate 02/05/22 1345 95  ?   Resp 02/05/22 1345 16  ?   Temp 02/05/22 1345 98.4 ?F (36.9 ?C)  ?   Temp src --   ?   SpO2 02/05/22 1345 98 %  ?   Weight --   ?   Height --   ?   Head Circumference --   ?   Peak Flow --   ?   Pain Score 02/05/22 1343 6  ?   Pain Loc --   ?   Pain Edu? --   ?   Excl. in GC? --   ? ?No data found. ? ?Updated Vital Signs ?BP 108/73   Pulse 95   Temp 98.4 ?F (36.9 ?C)   Resp 16   LMP  01/08/2022   SpO2 98%  ? ?Visual Acuity ?Right Eye Distance:   ?Left Eye Distance:   ?Bilateral Distance:   ? ?Right Eye Near:   ?Left Eye Near:    ?Bilateral Near:    ? ?Physical Exam ?Vitals reviewed.  ?Constitutional:   ?   General: She is awake. She is not in acute distress. ?   Appearance: Normal appearance. She is well-developed. She is not ill-appearing.  ?   Comments: Very pleasant female appears stated age in no acute distress sitting comfortably in exam room  ?HENT:  ?   Head: Normocephalic and atraumatic.  ?   Right Ear: Tympanic membrane, ear canal and external ear normal. Tympanic membrane is not erythematous or bulging.  ?   Left Ear: Ear canal and external ear normal. There is impacted cerumen.  ?   Nose:  ?   Right Sinus: No maxillary sinus tenderness or frontal sinus tenderness.  ?   Left Sinus: No maxillary sinus tenderness or frontal sinus tenderness.  ?   Mouth/Throat:  ?   Pharynx: Uvula midline. Posterior oropharyngeal erythema present. No oropharyngeal exudate.  ?Cardiovascular:  ?   Rate and Rhythm: Normal rate and regular rhythm.  ?   Heart sounds: Normal heart sounds, S1 normal and S2 normal. No murmur heard. ?Pulmonary:  ?   Effort: Pulmonary effort is normal.  ?   Breath sounds: Wheezing present. No rhonchi or rales.  ?   Comments: Scattered wheezing ?Abdominal:  ?   General: Bowel sounds are normal.  ?   Palpations: Abdomen is soft.  ?   Tenderness: There is no abdominal tenderness.  ?Genitourinary: ?   Comments: Exam deferred ?Psychiatric:     ?   Behavior: Behavior is cooperative.  ? ? ? ?UC Treatments / Results  ?Labs ?(all labs ordered are listed, but only abnormal results are displayed) ?Labs Reviewed  ?CERVICOVAGINAL ANCILLARY ONLY  ? ? ?EKG ? ? ?Radiology ?No results found. ? ?Procedures ?Procedures (including critical care time) ? ?Medications Ordered in UC ?Medications - No data to display ? ?Initial Impression / Assessment and Plan / UC Course  ?I have reviewed the triage  vital signs and the nursing notes. ? ?Pertinent labs & imaging results that were available during my care of the patient were reviewed by me and considered in my medical decision making (see chart for details). ? ?  ? ?No indication for viral testing given patient has been symptomatic for several weeks.  Concerned that illness has caused asthma exacerbation.  Refill of albuterol inhaler and nebulizer solution sent to pharmacy.  We will start prednisone burst.  She was instructed not  to take NSAIDs with this medication due to risk of GI bleeding.  She was started on Augmentin.  Discussed that if she has any worsening symptoms she needs to be reevaluated including fever, chest pain, shortness of breath, nausea/vomiting she needs to be seen immediately.  Strict return precautions given to which she expressed understanding. ? ?We will defer treatment until STI swab results are available.  Recommended hypoallergenic soaps and detergents.  She is to abstain from sexual practices until results are available.  If she develops any worsening symptoms she needs to return for reevaluation.  Strict return precautions given to which she expressed understanding ? ?Final Clinical Impressions(s) / UC Diagnoses  ? ?Final diagnoses:  ?Sinobronchitis  ?Mild intermittent asthma with acute exacerbation  ?Vaginal discharge  ? ? ? ?Discharge Instructions   ? ?  ?Start Augmentin twice daily to cover for infection.  Take prednisone 40 mg for 4 days.  Do not take NSAIDs including aspirin, ibuprofen/Advil, naproxen/Aleve with this medication.  Use Mucinex, Tylenol, Flonase for symptom relief.  I have sent in refills of your albuterol so please use these to manage her shortness of breath symptoms.  If symptoms are not improving or if anything worsens you need to return for reevaluation.  If anything becomes severe including chest pain, shortness of breath, worsening cough you should be seen immediately. ? ?We will contact you if we need to  arrange any treatment based on your swab result.  Please abstain from sex activity until results are obtained.  If you develop any pelvic pain, abdominal pain, fever, nausea, vomiting please return for reevaluatio

## 2022-02-06 ENCOUNTER — Telehealth (HOSPITAL_COMMUNITY): Payer: Self-pay | Admitting: Emergency Medicine

## 2022-02-06 LAB — CERVICOVAGINAL ANCILLARY ONLY
Bacterial Vaginitis (gardnerella): NEGATIVE
Candida Glabrata: NEGATIVE
Candida Vaginitis: POSITIVE — AB
Chlamydia: NEGATIVE
Comment: NEGATIVE
Comment: NEGATIVE
Comment: NEGATIVE
Comment: NEGATIVE
Comment: NEGATIVE
Comment: NORMAL
Neisseria Gonorrhea: NEGATIVE
Trichomonas: NEGATIVE

## 2022-02-06 MED ORDER — FLUCONAZOLE 150 MG PO TABS: 150.0000 mg | ORAL_TABLET | Freq: Once | ORAL | 0 refills | Status: AC

## 2022-02-19 ENCOUNTER — Other Ambulatory Visit: Payer: Self-pay

## 2022-02-19 ENCOUNTER — Ambulatory Visit (INDEPENDENT_AMBULATORY_CARE_PROVIDER_SITE_OTHER): Payer: Self-pay

## 2022-02-19 ENCOUNTER — Encounter: Payer: Self-pay | Admitting: Emergency Medicine

## 2022-02-19 ENCOUNTER — Ambulatory Visit
Admission: EM | Admit: 2022-02-19 | Discharge: 2022-02-19 | Disposition: A | Discharge status: Home / Self Care | Payer: Self-pay | Attending: Internal Medicine | Admitting: Internal Medicine

## 2022-02-19 DIAGNOSIS — J069 Acute upper respiratory infection, unspecified: Secondary | ICD-10-CM

## 2022-02-19 DIAGNOSIS — S161XXA Strain of muscle, fascia and tendon at neck level, initial encounter: Secondary | ICD-10-CM

## 2022-02-19 DIAGNOSIS — R053 Chronic cough: Secondary | ICD-10-CM

## 2022-02-19 MED ORDER — PREDNISONE 10 MG (21) PO TBPK: ORAL_TABLET | Freq: Every day | ORAL | 0 refills | Status: DC

## 2022-02-19 NOTE — ED Provider Notes (Signed)
?[facility] ? ? ? ?CSN: [id number] ?Arrival date & time: 02/19/22  1433 ? ? ?  ? ?History   ?Chief Complaint ?Chief Complaint  ?Patient presents with  ? URI  ? ? ?HPI ?Kara Neal is a 21 y.o. female.  ? ?Patient presents with cough and runny nose that has been present for approximately 1 month.  Patient was seen in a different urgent care on 02/05/2022 and was prescribed albuterol inhaler, Augmentin, prednisone with minimal improvement in symptoms.  Patient does have a history of asthma but denies any chest pain or shortness of breath.  Denies any known fevers as well.  Denies sore throat, ear pain, nausea, vomiting, diarrhea, abdominal pain. Patient also presenting with right-sided neck pain that extends into right upper back that has been present for a few weeks as well.  Denies any apparent injury.  Denies history of chronic neck or back pain.  Patient has taken over-the-counter ibuprofen and Tylenol with minimal improvement in symptoms.  Denies any numbness or tingling.  Movement does not exacerbate pain.  It is a constant pain for patient. ? ? ?URI ? ?Past Medical History:  ?Diagnosis Date  ? Asthma   ? ? ?There are no problems to display for this patient. ? ? ?History reviewed. No pertinent surgical history. ? ?OB History   ?No obstetric history on file. ?  ? ? ? ?Home Medications   ? ?Prior to Admission medications   ?Medication Sig Start Date End Date Taking? Authorizing Provider  ?predniSONE (STERAPRED UNI-PAK 21 TAB) 10 MG (21) TBPK tablet Take by mouth daily. Take 6 tabs by mouth daily  for 2 days, then 5 tabs for 2 days, then 4 tabs for 2 days, then 3 tabs for 2 days, 2 tabs for 2 days, then 1 tab by mouth daily for 2 days 02/19/22  Yes Shylyn Younce, Rolly Salter E, FNP  ?albuterol (PROVENTIL) (2.5 MG/3ML) 0.083% nebulizer solution Take 3 mLs (2.5 mg total) by nebulization every 6 (six) hours as needed for wheezing or shortness of breath. 02/05/22   Raspet, Noberto Retort, PA-C  ?albuterol (VENTOLIN HFA) 108 (90  Base) MCG/ACT inhaler Inhale 1-2 puffs into the lungs every 6 (six) hours as needed for wheezing or shortness of breath. 02/05/22   Raspet, Noberto Retort, PA-C  ?amoxicillin-clavulanate (AUGMENTIN) 875-125 MG tablet Take 1 tablet by mouth every 12 (twelve) hours. ?Patient not taking: Reported on 02/19/2022 02/05/22   Raspet, Noberto Retort, PA-C  ? ? ?Family History ?Family History  ?Problem Relation Age of Onset  ? Asthma Father   ? ? ?Social History ?Social History  ? ?Tobacco Use  ? Smoking status: Never  ? Smokeless tobacco: Never  ?Vaping Use  ? Vaping Use: Never used  ?Substance Use Topics  ? Alcohol use: Never  ? Drug use: Never  ? ? ? ?Allergies   ?Patient has no known allergies. ? ? ?Review of Systems ?Review of Systems ?Per HPI ? ?Physical Exam ?Triage Vital Signs ?ED Triage Vitals  ?Enc Vitals Group  ?   BP 02/19/22 1522 100/66  ?   Pulse Rate 02/19/22 1522 63  ?   Resp 02/19/22 1522 20  ?   Temp 02/19/22 1522 98.4 ?F (36.9 ?C)  ?   Temp Source 02/19/22 1522 Oral  ?   SpO2 02/19/22 1522 96 %  ?   Weight --   ?   Height --   ?   Head Circumference --   ?   Peak Flow --   ?  Pain Score 02/19/22 1520 5  ?   Pain Loc --   ?   Pain Edu? --   ?   Excl. in GC? --   ? ?No data found. ? ?Updated Vital Signs ?BP 100/66 (BP Location: Left Arm)   Pulse 63   Temp 98.4 ?F (36.9 ?C) (Oral)   Resp 20   SpO2 96%  ? ?Visual Acuity ?Right Eye Distance:   ?Left Eye Distance:   ?Bilateral Distance:   ? ?Right Eye Near:   ?Left Eye Near:    ?Bilateral Near:    ? ?Physical Exam ?Constitutional:   ?   General: She is not in acute distress. ?   Appearance: Normal appearance. She is not toxic-appearing or diaphoretic.  ?HENT:  ?   Head: Normocephalic and atraumatic.  ?   Right Ear: Tympanic membrane and ear canal normal.  ?   Left Ear: Tympanic membrane and ear canal normal.  ?   Nose: Congestion present.  ?   Mouth/Throat:  ?   Mouth: Mucous membranes are moist.  ?   Pharynx: No posterior oropharyngeal erythema.  ?Eyes:  ?   Extraocular  Movements: Extraocular movements intact.  ?   Conjunctiva/sclera: Conjunctivae normal.  ?   Pupils: Pupils are equal, round, and reactive to light.  ?Cardiovascular:  ?   Rate and Rhythm: Normal rate and regular rhythm.  ?   Pulses: Normal pulses.  ?   Heart sounds: Normal heart sounds.  ?Pulmonary:  ?   Effort: Pulmonary effort is normal. No respiratory distress.  ?   Breath sounds: Normal breath sounds. No stridor. No wheezing, rhonchi or rales.  ?Abdominal:  ?   General: Abdomen is flat. Bowel sounds are normal.  ?   Palpations: Abdomen is soft.  ?Musculoskeletal:     ?   General: Normal range of motion.  ?   Cervical back: Normal range of motion.  ?   Thoracic back: Normal.  ?   Lumbar back: Normal.  ?     Back: ? ?   Comments: Tenderness to palpation to area circled on diagram. No direct spinal tenderness, crepitus, step off noted. Has full ROM of neck.   ?Skin: ?   General: Skin is warm and dry.  ?Neurological:  ?   General: No focal deficit present.  ?   Mental Status: She is alert and oriented to person, place, and time. Mental status is at baseline.  ?Psychiatric:     ?   Mood and Affect: Mood normal.     ?   Behavior: Behavior normal.  ? ? ? ?UC Treatments / Results  ?Labs ?(all labs ordered are listed, but only abnormal results are displayed) ?Labs Reviewed - No data to display ? ?EKG ? ? ?Radiology ?DG Chest 2 View ? ?Result Date: 02/19/2022 ?CLINICAL DATA:  Cough and upper respiratory infection. EXAM: CHEST - 2 VIEW COMPARISON:  04/27/2021 FINDINGS: The cardiac silhouette, mediastinal and hilar contours are normal. The lungs are clear. No pleural effusions. The bony thorax is intact. IMPRESSION: No acute cardiopulmonary findings. Electronically Signed   By: Rudie MeyerP.  Gallerani M.D.   On: 02/19/2022 16:04   ? ?Procedures ?Procedures (including critical care time) ? ?Medications Ordered in UC ?Medications - No data to display ? ?Initial Impression / Assessment and Plan / UC Course  ?I have reviewed the triage  vital signs and the nursing notes. ? ?Pertinent labs & imaging results that were available during my care of the  patient were reviewed by me and considered in my medical decision making (see chart for details). ? ?  ? ?Chest x-ray was negative for any acute cardiopulmonary process.  Suspect possible bronchitis from persistent cough.  No signs of pneumonia on x-ray.  Do not think that additional antibiotic treatment is necessary at this time.  Neck/back pain appears to be a muscle strain.  Do not think imaging is necessary given no apparent injury and no direct spinal tenderness.  I do think the patient would benefit from another course of steroids as this would be helpful with upper respiratory symptoms, cough, neck and back pain.  Discussed supportive care with patient as well.  Discussed return precautions.  Patient verbalized understanding and was agreeable with plan. ?Final Clinical Impressions(s) / UC Diagnoses  ? ?Final diagnoses:  ?Acute upper respiratory infection  ?Persistent cough for 3 weeks or longer  ?Strain of neck muscle, initial encounter  ? ? ? ?Discharge Instructions   ? ?  ?Your chest x-ray was normal.  You have been prescribed prednisone steroid to help alleviate cough, upper respiratory symptoms, neck/back pain.  Please follow-up if symptoms persist or worsen. ? ? ? ? ?ED Prescriptions   ? ? Medication Sig Dispense Auth. Provider  ? predniSONE (STERAPRED UNI-PAK 21 TAB) 10 MG (21) TBPK tablet Take by mouth daily. Take 6 tabs by mouth daily  for 2 days, then 5 tabs for 2 days, then 4 tabs for 2 days, then 3 tabs for 2 days, 2 tabs for 2 days, then 1 tab by mouth daily for 2 days 42 tablet [place], [place]ie [place], [place]  ? ?  ? ?PDMP not reviewed this encounter. ?  ?Gustavus Bryant, [place] ?02/19/22 1617 ? ?

## 2022-02-19 NOTE — Discharge Instructions (Signed)
Your chest x-ray was normal.  You have been prescribed prednisone steroid to help alleviate cough, upper respiratory symptoms, neck/back pain.  Please follow-up if symptoms persist or worsen. ?

## 2022-02-19 NOTE — ED Triage Notes (Signed)
Cough, runny nose, back pain started 2 weeks ago.  Covid test was negative.  Patient is asking for a work note ?

## 2022-03-23 ENCOUNTER — Ambulatory Visit (HOSPITAL_COMMUNITY): Payer: Self-pay

## 2022-03-28 ENCOUNTER — Encounter (HOSPITAL_COMMUNITY): Payer: Self-pay | Admitting: Emergency Medicine

## 2022-03-28 ENCOUNTER — Ambulatory Visit (HOSPITAL_COMMUNITY)
Admission: EM | Admit: 2022-03-28 | Discharge: 2022-03-28 | Disposition: A | Discharge status: Home / Self Care | Payer: Self-pay | Attending: Physician Assistant | Admitting: Physician Assistant

## 2022-03-28 ENCOUNTER — Other Ambulatory Visit: Payer: Self-pay

## 2022-03-28 DIAGNOSIS — R102 Pelvic and perineal pain: Secondary | ICD-10-CM | POA: Insufficient documentation

## 2022-03-28 DIAGNOSIS — N730 Acute parametritis and pelvic cellulitis: Secondary | ICD-10-CM | POA: Insufficient documentation

## 2022-03-28 LAB — HEPATITIS C ANTIBODY: HCV Ab: NONREACTIVE

## 2022-03-28 LAB — POCT URINALYSIS DIPSTICK, ED / UC
Bilirubin Urine: NEGATIVE
Glucose, UA: NEGATIVE mg/dL
Ketones, ur: NEGATIVE mg/dL
Leukocytes,Ua: NEGATIVE
Nitrite: NEGATIVE
Protein, ur: NEGATIVE mg/dL
Specific Gravity, Urine: 1.02 (ref 1.005–1.030)
Urobilinogen, UA: 0.2 mg/dL (ref 0.0–1.0)
pH: 7 (ref 5.0–8.0)

## 2022-03-28 LAB — HIV ANTIBODY (ROUTINE TESTING W REFLEX): HIV Screen 4th Generation wRfx: NONREACTIVE

## 2022-03-28 LAB — POC URINE PREG, ED: Preg Test, Ur: NEGATIVE

## 2022-03-28 MED ORDER — LIDOCAINE HCL (PF) 1 % IJ SOLN
INTRAMUSCULAR | Status: AC
  Filled 2022-03-28: qty 2

## 2022-03-28 MED ORDER — CEFTRIAXONE SODIUM 500 MG IJ SOLR
INTRAMUSCULAR | Status: AC
  Filled 2022-03-28: qty 500

## 2022-03-28 MED ORDER — ONDANSETRON 4 MG PO TBDP: 4.0000 mg | ORAL_TABLET | Freq: Three times a day (TID) | ORAL | 0 refills | Status: DC | PRN

## 2022-03-28 MED ORDER — CEFTRIAXONE SODIUM 500 MG IJ SOLR
500.0000 mg | Freq: Once | INTRAMUSCULAR | Status: AC
  Administered 2022-03-28: 500 mg via INTRAMUSCULAR

## 2022-03-28 MED ORDER — DOXYCYCLINE HYCLATE 100 MG PO CAPS: 100.0000 mg | ORAL_CAPSULE | Freq: Two times a day (BID) | ORAL | 0 refills | Status: DC

## 2022-03-28 MED ORDER — ONDANSETRON 4 MG PO TBDP
4.0000 mg | ORAL_TABLET | Freq: Once | ORAL | Status: AC
  Administered 2022-03-28: 4 mg via ORAL

## 2022-03-28 MED ORDER — ONDANSETRON 4 MG PO TBDP
ORAL_TABLET | ORAL | Status: AC
  Filled 2022-03-28: qty 1

## 2022-03-28 MED ORDER — METRONIDAZOLE 500 MG PO TABS: 500.0000 mg | ORAL_TABLET | Freq: Two times a day (BID) | ORAL | 0 refills | Status: DC

## 2022-03-28 NOTE — ED Triage Notes (Addendum)
Lower abdominal cramping.  Cramping wakes patient in sleep.  This started 2 weeks ago.  Patient has vaginal discharge.  Patient reports she is having to urinate more frequently, but not painful.  Last bm was, cannot remember.   ? ?Patient states she thinks it is gonorrhea-history of the same ? ?Requesting blood work for std and complains of lump in vagina ?

## 2022-03-28 NOTE — ED Provider Notes (Signed)
?[facility] ? ? ? ?CSN: [id number] ?Arrival date & time: 03/28/22  1318 ? ? ?  ? ?History   ?Chief Complaint ?Chief Complaint  ?Patient presents with  ? SEXUALLY TRANSMITTED DISEASE  ? ? ?HPI ?Kara Neal is a 21 y.o. female.  ? ?Patient presents today with a several week history of lower abdominal pain that has worsened in the past several days.  She does report some vaginal discharge but denies any significant odor.  She is experiencing some pelvic pain.  She denies any fever but has had nausea and vomiting.  She denies history of gastrointestinal disorder.  She denies any urinary symptoms including frequency, urgency, hematuria.  She is interested in complete STI testing.  She is sexually active with female partners.  She has previously been active with men several years ago but consistently use condoms.  She denies any recent antibiotic use.  Denies any changes to personal hygiene products.  She is able to eat and drink despite nausea and vomiting symptoms. ? ? ?Past Medical History:  ?Diagnosis Date  ? Asthma   ? ? ?There are no problems to display for this patient. ? ? ?History reviewed. No pertinent surgical history. ? ?OB History   ?No obstetric history on file. ?  ? ? ? ?Home Medications   ? ?Prior to Admission medications   ?Medication Sig Start Date End Date Taking? Authorizing Provider  ?doxycycline (VIBRAMYCIN) 100 MG capsule Take 1 capsule (100 mg total) by mouth 2 (two) times daily. 03/28/22  Yes Mickle Campton K, PA-C  ?metroNIDAZOLE (FLAGYL) 500 MG tablet Take 1 tablet (500 mg total) by mouth 2 (two) times daily. 03/28/22  Yes Denene Alamillo K, PA-C  ?ondansetron (ZOFRAN-ODT) 4 MG disintegrating tablet Take 1 tablet (4 mg total) by mouth every 8 (eight) hours as needed for nausea or vomiting. 03/28/22  Yes Iman Orourke K, PA-C  ?albuterol (PROVENTIL) (2.5 MG/3ML) 0.083% nebulizer solution Take 3 mLs (2.5 mg total) by nebulization every 6 (six) hours as needed for wheezing or shortness of breath.  02/05/22   Jini Horiuchi, Noberto Retort, PA-C  ?albuterol (VENTOLIN HFA) 108 (90 Base) MCG/ACT inhaler Inhale 1-2 puffs into the lungs every 6 (six) hours as needed for wheezing or shortness of breath. 02/05/22   Margeret Stachnik, Noberto Retort, PA-C  ? ? ?Family History ?Family History  ?Problem Relation Age of Onset  ? Asthma Father   ? ? ?Social History ?Social History  ? ?Tobacco Use  ? Smoking status: Never  ? Smokeless tobacco: Never  ?Vaping Use  ? Vaping Use: Never used  ?Substance Use Topics  ? Alcohol use: Never  ? Drug use: Never  ? ? ? ?Allergies   ?Patient has no known allergies. ? ? ?Review of Systems ?Review of Systems  ?Constitutional:  Negative for activity change, appetite change, fatigue and fever.  ?Gastrointestinal:  Positive for abdominal pain, nausea and vomiting. Negative for diarrhea.  ?Genitourinary:  Positive for frequency, pelvic pain and vaginal discharge. Negative for dysuria, flank pain, genital sores, urgency, vaginal bleeding and vaginal pain.  ? ? ?Physical Exam ?Triage Vital Signs ?ED Triage Vitals  ?Enc Vitals Group  ?   BP 03/28/22 1415 120/79  ?   Pulse Rate 03/28/22 1415 67  ?   Resp 03/28/22 1415 18  ?   Temp 03/28/22 1415 97.8 ?F (36.6 ?C)  ?   Temp Source 03/28/22 1415 Oral  ?   SpO2 03/28/22 1415 100 %  ?   Weight --   ?  Height --   ?   Head Circumference --   ?   Peak Flow --   ?   Pain Score 03/28/22 1413 7  ?   Pain Loc --   ?   Pain Edu? --   ?   Excl. in GC? --   ? ?No data found. ? ?Updated Vital Signs ?BP 120/79 (BP Location: Left Arm)   Pulse 67   Temp 97.8 ?F (36.6 ?C) (Oral)   Resp 18   SpO2 100%  ? ?Visual Acuity ?Right Eye Distance:   ?Left Eye Distance:   ?Bilateral Distance:   ? ?Right Eye Near:   ?Left Eye Near:    ?Bilateral Near:    ? ?Physical Exam ?Vitals reviewed.  ?Constitutional:   ?   General: She is awake. She is not in acute distress. ?   Appearance: Normal appearance. She is well-developed. She is not ill-appearing.  ?   Comments: Very pleasant female appears stated age in  no acute distress sitting comfortably in exam room  ?HENT:  ?   Head: Normocephalic and atraumatic.  ?Cardiovascular:  ?   Rate and Rhythm: Normal rate and regular rhythm.  ?   Heart sounds: Normal heart sounds, S1 normal and S2 normal. No murmur heard. ?Pulmonary:  ?   Effort: Pulmonary effort is normal.  ?   Breath sounds: Normal breath sounds. No wheezing, rhonchi or rales.  ?   Comments: Clear to auscultation bilaterally ?Abdominal:  ?   Palpations: Abdomen is soft.  ?   Tenderness: There is abdominal tenderness in the right lower quadrant, suprapubic area and left lower quadrant. There is no right CVA tenderness, left CVA tenderness, guarding or rebound.  ?Genitourinary: ?   Labia:     ?   Right: No rash or tenderness.     ?   Left: No rash or tenderness.   ?   Vagina: Vaginal discharge present. No erythema.  ?   Cervix: Cervical motion tenderness present.  ?   Uterus: Normal.   ?   Adnexa: Right adnexa normal and left adnexa normal.  ?   Comments: Slightly enlarged Bartholin cyst left vaginal introitus without tenderness.  CMT on exam. ?Psychiatric:     ?   Behavior: Behavior is cooperative.  ? ? ? ?UC Treatments / Results  ?Labs ?(all labs ordered are listed, but only abnormal results are displayed) ?Labs Reviewed  ?POCT URINALYSIS DIPSTICK, ED / UC - Abnormal; Notable for the following components:  ?    Result Value  ? Hgb urine dipstick TRACE (*)   ? All other components within normal limits  ?HIV ANTIBODY (ROUTINE TESTING W REFLEX)  ?RPR  ?HEPATITIS C ANTIBODY  ?POC URINE PREG, ED  ?CERVICOVAGINAL ANCILLARY ONLY  ? ? ?EKG ? ? ?Radiology ?No results found. ? ?Procedures ?Procedures (including critical care time) ? ?Medications Ordered in UC ?Medications  ?cefTRIAXone (ROCEPHIN) injection 500 mg (has no administration in time range)  ?ondansetron (ZOFRAN-ODT) disintegrating tablet 4 mg (has no administration in time range)  ? ? ?Initial Impression / Assessment and Plan / UC Course  ?I have reviewed the  triage vital signs and the nursing notes. ? ?Pertinent labs & imaging results that were available during my care of the patient were reviewed by me and considered in my medical decision making (see chart for details). ? ?  ? ?UA showed trace hemoglobin but no evidence of infection.  Urine pregnancy was negative in clinic today.  Given pelvic pain on exam with CMT concern for PID.  STI swab and blood work collected today.  Will empirically treat with Rocephin in clinic and doxycycline/metronidazole for 14 days.  Recommended follow-up with OB/GYN and was given contact information for local provider.  She was given Zofran to manage nausea symptoms particularly as antibiotics will likely exacerbate this with instruction use every 8 hours as needed.  Discussed that if she has any worsening symptoms including abdominal pain, pelvic vomiting, fever, nausea, vomiting she needs to be seen immediately in the emergency room.  Strict return precautions given to which she expressed understanding. ? ?Final Clinical Impressions(s) / UC Diagnoses  ? ?Final diagnoses:  ?Pelvic pain  ?PID (acute pelvic inflammatory disease)  ? ? ? ?Discharge Instructions   ? ?  ?I am concerned that you might have an infection.  We gave you some antibiotics and I would like you to pick up additional antibiotic from the pharmacy.  These can upset your stomach.  Use Zofran for nausea symptoms.  Please follow-up with OB/GYN particular symptoms do not improve very quickly.  Call them to schedule an appointment as soon as possible.  If anything worsens and you have high fever, worsening abdominal pain, nausea/vomiting interfere with oral intake, worsening pelvic pain you need to go to the emergency room immediately. ? ? ? ? ?ED Prescriptions   ? ? Medication Sig Dispense Auth. Provider  ? doxycycline (VIBRAMYCIN) 100 MG capsule Take 1 capsule (100 mg total) by mouth 2 (two) times daily. 28 capsule Allysen Lazo K, PA-C  ? metroNIDAZOLE (FLAGYL) 500 MG  tablet Take 1 tablet (500 mg total) by mouth 2 (two) times daily. 28 tablet Florette Thai K, PA-C  ? ondansetron (ZOFRAN-ODT) 4 MG disintegrating tablet Take 1 tablet (4 mg total) by mouth every 8 (eight) hours as ne

## 2022-03-28 NOTE — Discharge Instructions (Signed)
I am concerned that you might have an infection.  We gave you some antibiotics and I would like you to pick up additional antibiotic from the pharmacy.  These can upset your stomach.  Use Zofran for nausea symptoms.  Please follow-up with OB/GYN particular symptoms do not improve very quickly.  Call them to schedule an appointment as soon as possible.  If anything worsens and you have high fever, worsening abdominal pain, nausea/vomiting interfere with oral intake, worsening pelvic pain you need to go to the emergency room immediately. ?

## 2022-03-29 LAB — RPR: RPR Ser Ql: NONREACTIVE

## 2022-03-30 LAB — CERVICOVAGINAL ANCILLARY ONLY
Bacterial Vaginitis (gardnerella): NEGATIVE
Candida Glabrata: NEGATIVE
Candida Vaginitis: NEGATIVE
Chlamydia: NEGATIVE
Comment: NEGATIVE
Comment: NEGATIVE
Comment: NEGATIVE
Comment: NEGATIVE
Comment: NEGATIVE
Comment: NORMAL
Neisseria Gonorrhea: POSITIVE — AB
Trichomonas: NEGATIVE

## 2022-10-11 ENCOUNTER — Encounter (HOSPITAL_COMMUNITY): Payer: Self-pay

## 2022-10-11 ENCOUNTER — Emergency Department (HOSPITAL_COMMUNITY)
Admission: EM | Admit: 2022-10-11 | Discharge: 2022-10-11 | Disposition: A | Discharge status: Home / Self Care | Payer: Self-pay | Attending: Emergency Medicine | Admitting: Emergency Medicine

## 2022-10-11 ENCOUNTER — Emergency Department (HOSPITAL_COMMUNITY): Payer: Self-pay

## 2022-10-11 ENCOUNTER — Other Ambulatory Visit: Payer: Self-pay

## 2022-10-11 DIAGNOSIS — B9689 Other specified bacterial agents as the cause of diseases classified elsewhere: Secondary | ICD-10-CM

## 2022-10-11 DIAGNOSIS — N760 Acute vaginitis: Secondary | ICD-10-CM | POA: Insufficient documentation

## 2022-10-11 DIAGNOSIS — B349 Viral infection, unspecified: Secondary | ICD-10-CM | POA: Insufficient documentation

## 2022-10-11 DIAGNOSIS — Z1152 Encounter for screening for COVID-19: Secondary | ICD-10-CM | POA: Insufficient documentation

## 2022-10-11 DIAGNOSIS — J45909 Unspecified asthma, uncomplicated: Secondary | ICD-10-CM | POA: Insufficient documentation

## 2022-10-11 LAB — RPR: RPR Ser Ql: NONREACTIVE

## 2022-10-11 LAB — WET PREP, GENITAL
Sperm: NONE SEEN
Trich, Wet Prep: NONE SEEN
WBC, Wet Prep HPF POC: <10 (ref <10)
Yeast Wet Prep HPF POC: NONE SEEN

## 2022-10-11 LAB — RESP PANEL BY RT-PCR (FLU A&B, COVID) ARPGX2
Influenza A by PCR: NEGATIVE
Influenza B by PCR: NEGATIVE
SARS Coronavirus 2 by RT PCR: NEGATIVE

## 2022-10-11 LAB — HIV ANTIBODY (ROUTINE TESTING W REFLEX): HIV Screen 4th Generation wRfx: NONREACTIVE

## 2022-10-11 LAB — I-STAT BETA HCG BLOOD, ED (MC, WL, AP ONLY): I-stat hCG, quantitative: <5 m[IU]/mL (ref <5)

## 2022-10-11 MED ORDER — CEFTRIAXONE SODIUM 500 MG IJ SOLR
500.0000 mg | Freq: Once | INTRAMUSCULAR | Status: AC
  Administered 2022-10-11: 500 mg via INTRAMUSCULAR
  Filled 2022-10-11: qty 500

## 2022-10-11 MED ORDER — METRONIDAZOLE 500 MG PO TABS: 500.0000 mg | ORAL_TABLET | Freq: Two times a day (BID) | ORAL | 0 refills | Status: AC

## 2022-10-11 MED ORDER — LIDOCAINE HCL (PF) 1 % IJ SOLN
INTRAMUSCULAR | Status: AC
  Administered 2022-10-11: 1 mL
  Filled 2022-10-11: qty 5

## 2022-10-11 MED ORDER — ONDANSETRON HCL 4 MG/2ML IJ SOLN: 4.0000 mg | Freq: Once | INTRAMUSCULAR | Status: DC

## 2022-10-11 MED ORDER — ONDANSETRON 4 MG PO TBDP
4.0000 mg | ORAL_TABLET | Freq: Once | ORAL | Status: AC
  Administered 2022-10-11: 4 mg via ORAL
  Filled 2022-10-11: qty 1

## 2022-10-11 MED ORDER — DOXYCYCLINE HYCLATE 100 MG PO TABS
100.0000 mg | ORAL_TABLET | Freq: Once | ORAL | Status: AC
Start: 2022-10-11 — End: 2022-10-11
  Administered 2022-10-11: 100 mg via ORAL
  Filled 2022-10-11: qty 1

## 2022-10-11 MED ORDER — METRONIDAZOLE 500 MG PO TABS
500.0000 mg | ORAL_TABLET | Freq: Once | ORAL | Status: AC
  Administered 2022-10-11: 500 mg via ORAL
  Filled 2022-10-11: qty 1

## 2022-10-11 MED ORDER — DOXYCYCLINE HYCLATE 100 MG PO CAPS: 100.0000 mg | ORAL_CAPSULE | Freq: Two times a day (BID) | ORAL | 0 refills | Status: AC

## 2022-10-11 MED ORDER — ONDANSETRON 4 MG PO TBDP: 4.0000 mg | ORAL_TABLET | Freq: Three times a day (TID) | ORAL | 0 refills | Status: DC | PRN

## 2022-10-11 NOTE — Discharge Instructions (Addendum)
Your work-up today was overall reassuring, your gonorrhea and Chlamydia tests are still pending.  You can follow-up on these results via MyChart.  However regardless you have been treated for this today.  Please take the doxycycline as directed until finished.  Be careful with sun exposure when taking this medication as it causes sensitivity to light.  Take the Flagyl for bacterial vaginosis, do not drink on this medication as it can make you vomit.  Use Zofran as needed for nausea.  Continue to drink plenty of fluids.  Please return to the ER for any new or worsening symptoms.

## 2022-10-11 NOTE — ED Provider Triage Note (Signed)
Emergency Medicine Provider Triage Evaluation Note  Kara Neal , a 21 y.o. female  was evaluated in triage.  Pt complains of possible COVID exposure.  Reports 1 week of body aches and chills, with some nausea and mild diarrhea.  Also requesting STD testing.  Initially states she does not have symptoms that would concern her for an STD.  Then retracts this and states she has had some vaginal discharge and some pain in the rib cage area for about a week.  Denies urinary symptoms or pelvic pain.  Review of Systems  Positive:  Negative: See above  Physical Exam  BP 112/71   Pulse 66   Temp 98.3 F (36.8 C)   Resp 18   Ht 5\' 6"  (1.676 m)   SpO2 100%  Gen:   Awake, no distress, sitting comfortably Resp:  Normal effort  MSK:   Moves extremities without difficulty  Other:  Abdomen soft, nontender  Medical Decision Making  Medically screening exam initiated at 9:58 AM.  Appropriate orders placed.  Kara Neal was informed that the remainder of the evaluation will be completed by another provider, this initial triage assessment does not replace that evaluation, and the importance of remaining in the ED until their evaluation is complete.     Laural Benes, PA-C 10/11/22 1009

## 2022-10-11 NOTE — ED Triage Notes (Signed)
Pt arrived POV from home c/o generalized body aches, chills, N/V/D, runny nose. Pt states her cousin tested positive for COVID but I haven't really been around him like that. Pt states these symptoms started a week ago.

## 2022-10-11 NOTE — ED Provider Notes (Signed)
[facility] EMERGENCY DEPARTMENT Provider Note   CSN: [id number] Arrival date & time: 10/11/22  0912     History  Chief Complaint  Patient presents with   Generalized Body Aches    Kara Neal is a 21 y.o. female.  HPI 21 year old female with history of asthma presented to the ER with complaints of generalized body aches, chills, nausea, vomiting, diarrhea, nasal congestion and dry cough.  Reported that her cousin tested positive for COVID.  She also endorses mild generalized abdominal pain.  Also requesting STD testing and concern for "contracting something from my work" as she works at a nursing facility.  She also has been having increased white vaginal discharge.  She is sexually active.    Home Medications Prior to Admission medications   Medication Sig Start Date End Date Taking? Authorizing Provider  doxycycline (VIBRAMYCIN) 100 MG capsule Take 1 capsule (100 mg total) by mouth 2 (two) times daily for 7 days. 10/11/22 10/18/22 Yes Mare Ferrari, PA-C  metroNIDAZOLE (FLAGYL) 500 MG tablet Take 1 tablet (500 mg total) by mouth 2 (two) times daily for 7 days. 10/11/22 10/18/22 Yes Mare Ferrari, PA-C  albuterol (PROVENTIL) (2.5 MG/3ML) 0.083% nebulizer solution Take 3 mLs (2.5 mg total) by nebulization every 6 (six) hours as needed for wheezing or shortness of breath. 02/05/22   Raspet, Noberto Retort, PA-C  albuterol (VENTOLIN HFA) 108 (90 Base) MCG/ACT inhaler Inhale 1-2 puffs into the lungs every 6 (six) hours as needed for wheezing or shortness of breath. 02/05/22   Raspet, Erin K, PA-C  ondansetron (ZOFRAN-ODT) 4 MG disintegrating tablet Take 1 tablet (4 mg total) by mouth every 8 (eight) hours as needed for nausea or vomiting. 10/11/22   Mare Ferrari, PA-C      Allergies    Patient has no known allergies.    Review of Systems   Review of Systems Ten systems reviewed and are negative for acute change, except as noted in the HPI.   Physical  Exam Updated Vital Signs BP 109/76   Pulse 62   Temp 98.7 F (37.1 C)   Resp 16   Ht 5\' 6"  (1.676 m)   SpO2 96%  Physical Exam Vitals and nursing note reviewed.  Constitutional:      General: She is not in acute distress.    Appearance: She is well-developed. She is not ill-appearing or diaphoretic.  HENT:     Head: Normocephalic and atraumatic.  Eyes:     Conjunctiva/sclera: Conjunctivae normal.  Cardiovascular:     Rate and Rhythm: Normal rate and regular rhythm.     Heart sounds: No murmur heard. Pulmonary:     Effort: Pulmonary effort is normal. No respiratory distress.     Breath sounds: Normal breath sounds.  Abdominal:     Palpations: Abdomen is soft.     Tenderness: There is no abdominal tenderness.  Musculoskeletal:        General: No swelling.     Cervical back: Neck supple.  Skin:    General: Skin is warm and dry.     Capillary Refill: Capillary refill takes less than 2 seconds.  Neurological:     General: No focal deficit present.     Mental Status: She is alert and oriented to person, place, and time.  Psychiatric:        Mood and Affect: Mood normal.     ED Results / Procedures / Treatments   Labs (all labs ordered are listed,  but only abnormal results are displayed) Labs Reviewed  WET PREP, GENITAL - Abnormal; Notable for the following components:      Result Value   Clue Cells Wet Prep HPF POC PRESENT (*)    All other components within normal limits  GC/CHLAMYDIA PROBE AMP ([place]) NOT AT [facility] - Abnormal; Notable for the following components:   Neisseria Gonorrhea Positive (*)    All other components within normal limits  RESP PANEL BY RT-PCR (FLU A&B, COVID) ARPGX2  RPR  HIV ANTIBODY (ROUTINE TESTING W REFLEX)  I-STAT BETA HCG BLOOD, ED ([facility], [facility], [facility] ONLY)    EKG None  Radiology DG Chest Portable 1 View  Result Date: 10/11/2022 CLINICAL DATA:  Cough EXAM: PORTABLE CHEST 1 VIEW COMPARISON:  02/19/2022 FINDINGS: The heart size and  mediastinal contours are within normal limits. Both lungs are clear. The visualized skeletal structures are unremarkable. IMPRESSION: No active disease. Electronically Signed   By: Duanne Guess D.O.   On: 10/11/2022 13:18    Procedures Procedures    Medications Ordered in ED Medications  ondansetron (ZOFRAN-ODT) disintegrating tablet 4 mg (4 mg Oral Given 10/11/22 1408)  cefTRIAXone (ROCEPHIN) injection 500 mg (500 mg Intramuscular Given 10/11/22 1409)  doxycycline (VIBRA-TABS) tablet 100 mg (100 mg Oral Given 10/11/22 1408)  metroNIDAZOLE (FLAGYL) tablet 500 mg (500 mg Oral Given 10/11/22 1408)  lidocaine (PF) (XYLOCAINE) 1 % injection (1 mL  Given 10/11/22 1409)    ED Course/ Medical Decision Making/ A&P                           Medical Decision Making Amount and/or Complexity of Data Reviewed Radiology: ordered.  Risk Prescription drug management.  21 year old female presenting with complaints of URI type symptoms.  COVID and flu are negative.  Chest x-ray negative for pneumonia.  Wet prep with clue cells.  Pregnancy is negative.  HIV and RPR sent per patient's request.  Patient requesting STI treatment, received Rocephin, Doxy, Flagyl.  Will send home with Doxy and Flagyl for BV.  Encouraged to follow-up on results via MyChart.  We discussed return precautions.  She was understanding and is agreeable.  Stable for discharge. Final Clinical Impression(s) / ED Diagnoses Final diagnoses:  Viral syndrome  Bacterial vaginosis    Rx / DC Orders ED Discharge Orders          Ordered    doxycycline (VIBRAMYCIN) 100 MG capsule  2 times daily        10/11/22 1427    metroNIDAZOLE (FLAGYL) 500 MG tablet  2 times daily        10/11/22 1427    ondansetron (ZOFRAN-ODT) 4 MG disintegrating tablet  Every 8 hours PRN        10/11/22 1427              Mare Ferrari, PA-C 10/13/22 0014    Gwyneth Sprout, MD 10/22/22 1331

## 2022-10-12 LAB — GC/CHLAMYDIA PROBE AMP (~~LOC~~) NOT AT ARMC
Chlamydia: NEGATIVE
Comment: NEGATIVE
Comment: NORMAL
Neisseria Gonorrhea: POSITIVE — AB

## 2022-10-20 ENCOUNTER — Telehealth: Payer: Self-pay | Admitting: Family Medicine

## 2022-10-20 ENCOUNTER — Telehealth: Payer: Self-pay | Admitting: Physician Assistant

## 2022-10-20 DIAGNOSIS — N760 Acute vaginitis: Secondary | ICD-10-CM

## 2022-10-20 DIAGNOSIS — N898 Other specified noninflammatory disorders of vagina: Secondary | ICD-10-CM

## 2022-10-20 NOTE — Patient Instructions (Signed)
  Anise Salvo, thank you for joining Piedad Climes, PA-C for today's virtual visit.  While this provider is not your primary care provider (PCP), if your PCP is located in our provider database this encounter information will be shared with them immediately following your visit.   A [facility] MyChart account gives you access to today's visit and all your visits, tests, and labs performed at [facility] " click here if you don't have a [facility] MyChart account or go to [email or web address]  Consent: (Patient) Meaghen Vecchiarelli provided verbal consent for this virtual visit at the beginning of the encounter.  Current Medications:  Current Outpatient Medications:    albuterol (PROVENTIL) (2.5 MG/3ML) 0.083% nebulizer solution, Take 3 mLs (2.5 mg total) by nebulization every 6 (six) hours as needed for wheezing or shortness of breath., Disp: 75 mL, Rfl: 0   albuterol (VENTOLIN HFA) 108 (90 Base) MCG/ACT inhaler, Inhale 1-2 puffs into the lungs every 6 (six) hours as needed for wheezing or shortness of breath., Disp: 8 g, Rfl: 0   ondansetron (ZOFRAN-ODT) 4 MG disintegrating tablet, Take 1 tablet (4 mg total) by mouth every 8 (eight) hours as needed for nausea or vomiting., Disp: 20 tablet, Rfl: 0   Medications ordered in this encounter:  No orders of the defined types were placed in this encounter.    *If you need refills on other medications prior to your next appointment, please contact your pharmacy*  Follow-Up: Call back or seek an in-person evaluation if the symptoms worsen or if the condition fails to improve as anticipated.  [facility] [phone]  Other Instructions Please use link below to be scheduled in person at one of our urgent care facilities today. Continue to refrain from any sexual activity until reassessed, treated and symptoms resolved.    If you have been instructed to have an in-person evaluation today at a local Urgent  Care facility, please use the link below. It will take you to a list of all of our available [facility], including address, phone number and hours of operation. Please do not delay care.  [facility]  If you or a family member do not have a primary care provider, use the link below to schedule a visit and establish care. When you choose a [facility] primary care physician or advanced practice provider, you gain a long-term partner in health. Find a Primary Care Provider  Learn more about [facility]'s in-office and virtual care options: [facility] - Get Care Now

## 2022-10-20 NOTE — Progress Notes (Signed)
Virtual Visit Consent   Kara Neal, you are scheduled for a virtual visit with a [facility] provider today. Just as with appointments in the office, your consent must be obtained to participate. Your consent will be active for this visit and any virtual visit you may have with one of our providers in the next 365 days. If you have a MyChart account, a copy of this consent can be sent to you electronically.  As this is a virtual visit, video technology does not allow for your provider to perform a traditional examination. This may limit your provider's ability to fully assess your condition. If your provider identifies any concerns that need to be evaluated in person or the need to arrange testing (such as labs, EKG, etc.), we will make arrangements to do so. Although advances in technology are sophisticated, we cannot ensure that it will always work on either your end or our end. If the connection with a video visit is poor, the visit may have to be switched to a telephone visit. With either a video or telephone visit, we are not always able to ensure that we have a secure connection.  By engaging in this virtual visit, you consent to the provision of healthcare and authorize for your insurance to be billed (if applicable) for the services provided during this visit. Depending on your insurance coverage, you may receive a charge related to this service.  I need to obtain your verbal consent now. Are you willing to proceed with your visit today? Kara Neal has provided verbal consent on 10/20/2022 for a virtual visit (video or telephone). Kara Neal, [place]  Date: 10/20/2022 9:52 AM  Virtual Visit via Video Note   I, Kara Neal, connected with  Kara Neal  ([id number], 19-Feb-2001) on 10/20/22 at  9:45 AM EST by a video-enabled telemedicine application and verified that I am speaking with the correct person using two identifiers.  Location: Patient: Virtual Visit  Location Patient: Home Provider: Virtual Visit Location Provider: Home Office   I discussed the limitations of evaluation and management by telemedicine and the availability of in person appointments. The patient expressed understanding and agreed to proceed.    History of Present Illness: Kara Neal is a 21 y.o. who identifies as a female who was assigned female at birth, and is being seen today for continued vaginal discharge with pelvic irritation and odor. Was recently seen in ER and diagnosed and treated for Gonorrhea (IM Rocephin and oral Doxycycline) and BV (Oral Flagyl). Notes completing entire courses of medication with continued symptoms. No sexual activity since diagnosis.   HPI: HPI  Problems: There are no problems to display for this patient.   Allergies: No Known Allergies Medications:  Current Outpatient Medications:    albuterol (PROVENTIL) (2.5 MG/3ML) 0.083% nebulizer solution, Take 3 mLs (2.5 mg total) by nebulization every 6 (six) hours as needed for wheezing or shortness of breath., Disp: 75 mL, Rfl: 0   albuterol (VENTOLIN HFA) 108 (90 Base) MCG/ACT inhaler, Inhale 1-2 puffs into the lungs every 6 (six) hours as needed for wheezing or shortness of breath., Disp: 8 g, Rfl: 0   ondansetron (ZOFRAN-ODT) 4 MG disintegrating tablet, Take 1 tablet (4 mg total) by mouth every 8 (eight) hours as needed for nausea or vomiting., Disp: 20 tablet, Rfl: 0  Observations/Objective: Patient is well-developed, well-nourished in no acute distress.  Resting comfortably at home.  Head is normocephalic, atraumatic.  No labored breathing. Speech is clear and coherent  with logical content.  Patient is alert and oriented at baseline.   Assessment and Plan: 1. Recurrent vaginitis  Incomplete treatment for gonorrhea or BV, or could be start of antibiotic-induced yeast infection or combination of any/all of the aforementioned issues. Needs in-person evaluation. Link sent so she can be  evaluated today at [facility]. No charge.   Follow Up Instructions: I discussed the assessment and treatment plan with the patient. The patient was provided an opportunity to ask questions and all were answered. The patient agreed with the plan and demonstrated an understanding of the instructions.  A copy of instructions were sent to the patient via MyChart unless otherwise noted below.   The patient was advised to call back or seek an in-person evaluation if the symptoms worsen or if the condition fails to improve as anticipated.  Time:  I spent 10 minutes with the patient via telehealth technology discussing the above problems/concerns.    Kara Climes, PA-C

## 2022-10-20 NOTE — Progress Notes (Signed)
Because given recurrent symptoms you need to have repeat testing and exam, I feel your condition warrants further evaluation and I recommend that you be seen in a face to face visit.   NOTE: There will be NO CHARGE for this eVisit   If you are having a true medical emergency please call 911.      For an urgent face to face visit, [place] has seven urgent care centers

## 2022-12-18 ENCOUNTER — Telehealth: Payer: Self-pay | Admitting: Physician Assistant

## 2022-12-18 ENCOUNTER — Ambulatory Visit: Payer: Self-pay

## 2022-12-18 NOTE — Progress Notes (Signed)
The patient no-showed for appointment despite this provider sending direct link x 2 with no response and waiting for at least 10 minutes from appointment time for patient to join. They will be marked as a NS for this appointment/time.   Avigdor Dollar M Ricky Gallery, PA-C    

## 2022-12-19 ENCOUNTER — Telehealth: Payer: Self-pay | Admitting: Family Medicine

## 2022-12-19 DIAGNOSIS — A64 Unspecified sexually transmitted disease: Secondary | ICD-10-CM

## 2022-12-19 DIAGNOSIS — Z8619 Personal history of other infectious and parasitic diseases: Secondary | ICD-10-CM

## 2022-12-19 NOTE — Progress Notes (Signed)
Because Ms. Kara Neal,  I feel your condition warrants further evaluation and I recommend that you be seen in a face to face visit with your gynecologist or at one of our [facility] clinics.   NOTE: There will be NO CHARGE for this eVisit   If you are having a true medical emergency please call 911.    *Center for [facility] at [facility]             [address], [place], [place] [address] [phone] (*Take patients with no insurance)  *Center for [facility] at [place], [place],  [place]  [address] [phone] (*Take patients with no insurance)  Center for [facility] at [place], [place], [place], [place], [address] [phone]  Center for [facility] at [place], [place], [place], [place], [address] [phone]  Center for [facility] at [facility] [address], [place], [place], [place], [address] [phone]  Center for [facility] at [facility]                                 [place], [place], [place], [address] [phone]  Center for [facility] at [facility]                                    [address] [place], [place], 09/01/2001 [place] [facility] [place] at [facility] [address] [place], [place], [place], [address]                              [facility] [place], [place], [place], [place], [address] [phone]  Your MyChart E-visit questionnaire answers were reviewed by a board certified advanced clinical practitioner to complete your personal care plan based on your specific symptoms.  Thank you for using e-Visits.

## 2022-12-19 NOTE — Patient Instructions (Signed)
Preventing Sexually Transmitted Infections, Adult Sexually transmitted infections (STIs) are spread from person to person (are contagious). They are spread, or transmitted, during sex. The sex may be vaginal, anal, or oral. STIs can be passed during sexual contact with skin, genitals, mouth, or rectum. They may spread through body fluids, such as saliva, semen, blood, vaginal mucus, and urine. STIs are very common. They can happen in people of all ages. Some common STIs are: Herpes. Hepatitis B. Chlamydia. Gonorrhea. Syphilis. Trichomoniasis. Human papillomavirus (HPV). Human immunodeficiency virus (HIV). This can cause acquired immunodeficiency syndrome (AIDS). How can STIs affect me? You may not have symptoms with an STI. Even if you do not have symptoms, you can still spread the infection to others. You also still need treatment. STIs can be treated. Some STIs can be cured. Other STIs cannot be cured and will affect you for the rest of your life. Certain STIs may: Require you to take medicine for the rest of your life. Affect your ability to have children. Increase your risk for getting other STIs. Increase your risk of getting certain conditions. These may include: Cervical cancer. Pelvic inflammatory disease (PID). Organ damage or damage to other parts of your body. This can happen if the infection spreads. Cause problems during pregnancy. STIs may be spread to the baby during pregnancy or birth. Females tend to have more severe problems from STIs than males. What can increase my risk? You may be more at risk for an STI if: You do not use protection during sex. You have more than one sex partner. You have a sex partner who has other sex partners. You have sex with a person who has an STI. You have an STI, or you have had an STI before. You inject drugs or have a sex partner who injects drugs. What actions can I take to prevent STIs? The only way to fully prevent STIs is not to  have sex of any kind. This is called practicing abstinence. If you are sexually active, you can protect yourself and others by taking these actions to lower your risk of getting an STI: Lifestyle Have only one sex partner or limit the number of sex partners you have. Avoid having sex after you have alcohol or drugs. Alcohol and drugs can affect your ability to make good choices. This can lead to risky sexual behaviors. Go to prevention counseling. This can teach you how to avoid getting an STI. Barrier protection  Use methods to stop body fluids from being exchanged between partners during sex (barrier protection). These methods can be used during oral, vaginal, or anal sex. They include: External condom, for males. Internal condom, for females. Dental dam. Use a new barrier method for every sex act from start to finish. Know that a barrier method may not protect you from all STIs. Some STIs, such as herpes, are spread through skin-to-skin contact. Avoid all sexual contact if you or a partner has herpes and there is an active flare with open sores. Birth control pills, injections, implants, and intrauterine devices (IUDs) do not protect against STIs. To prevent both STIs and pregnancy, always use a condom with a second form of birth control. General information Ask your health care provider about taking pre-exposure prophylaxis (PrEP) to prevent HIV. Stay up to date on your vaccines. Some vaccines can lower your risk of getting certain STIs. These include: Hepatitis B vaccine. HPV vaccine. This is recommended for people up to age 26. Get tested for STIs. Have your   partners get tested, too. If you test positive for an STI, follow recommendations from your health care provider about treatment. Make sure your sex partners are tested and treated as well. Where to find more information Learn more about STIs from: Centers for Disease Control and Prevention (CDC): More information about certain  STIs: cdc.gov Places to get sexual health counseling and treatment for free or at a low cost: gettested.cdc.gov U.S. Department of Health and Human Services (HHS): womenshealth.gov This information is not intended to replace advice given to you by your health care provider. Make sure you discuss any questions you have with your health care provider. Document Revised: 06/23/2022 Document Reviewed: 04/24/2022 Elsevier Patient Education  2023 Elsevier Inc.  

## 2022-12-19 NOTE — Progress Notes (Signed)
Virtual Visit Consent   Bret Stamour, you are scheduled for a virtual visit with a [facility] provider today. Just as with appointments in the office, your consent must be obtained to participate. Your consent will be active for this visit and any virtual visit you may have with one of our providers in the next 365 days. If you have a MyChart account, a copy of this consent can be sent to you electronically.  As this is a virtual visit, video technology does not allow for your provider to perform a traditional examination. This may limit your provider's ability to fully assess your condition. If your provider identifies any concerns that need to be evaluated in person or the need to arrange testing (such as labs, EKG, etc.), we will make arrangements to do so. Although advances in technology are sophisticated, we cannot ensure that it will always work on either your end or our end. If the connection with a video visit is poor, the visit may have to be switched to a telephone visit. With either a video or telephone visit, we are not always able to ensure that we have a secure connection.  By engaging in this virtual visit, you consent to the provision of healthcare and authorize for your insurance to be billed (if applicable) for the services provided during this visit. Depending on your insurance coverage, you may receive a charge related to this service.  I need to obtain your verbal consent now. Are you willing to proceed with your visit today? Kara Neal has provided verbal consent on 12/19/2022 for a virtual visit (video or telephone). Dellia Nims, FNP  Date: 12/19/2022 1:29 PM  Virtual Visit via Video Note   I, Dellia Nims, connected with  Kara Neal  ([id number], 02-Nov-2001) on 12/19/22 at  1:15 PM EST by a video-enabled telemedicine application and verified that I am speaking with the correct person using two identifiers.  Location: Patient: Virtual Visit Location Patient:  Home Provider: Virtual Visit Location Provider: Home Office   I discussed the limitations of evaluation and management by telemedicine and the availability of in person appointments. The patient expressed understanding and agreed to proceed.    History of Present Illness: Kara Neal is a 22 y.o. who identifies as a female who was assigned female at birth, and is being seen today for concerned gonorrhea never completely resolved from November. She does not have insurance but is having the same discharge. Discussed at length and she will call the health dept there for futher options. Advised we do not treat STI's over telehealth. .  HPI: HPI  Problems: There are no problems to display for this patient.   Allergies: No Known Allergies Medications:  Current Outpatient Medications:    albuterol (PROVENTIL) (2.5 MG/3ML) 0.083% nebulizer solution, Take 3 mLs (2.5 mg total) by nebulization every 6 (six) hours as needed for wheezing or shortness of breath., Disp: 75 mL, Rfl: 0   albuterol (VENTOLIN HFA) 108 (90 Base) MCG/ACT inhaler, Inhale 1-2 puffs into the lungs every 6 (six) hours as needed for wheezing or shortness of breath., Disp: 8 g, Rfl: 0   ondansetron (ZOFRAN-ODT) 4 MG disintegrating tablet, Take 1 tablet (4 mg total) by mouth every 8 (eight) hours as needed for nausea or vomiting., Disp: 20 tablet, Rfl: 0  Observations/Objective: Patient is well-developed, well-nourished in no acute distress.  Resting comfortably  at home.  Head is normocephalic, atraumatic.  No labored breathing.  Speech is clear and coherent with logical  content.  Patient is alert and oriented at baseline.    Assessment and Plan: 1. STI (sexually transmitted infection)  2. History of gonorrhea  Follow at health dept or urgent care.   Follow Up Instructions: I discussed the assessment and treatment plan with the patient. The patient was provided an opportunity to ask questions and all were answered. The  patient agreed with the plan and demonstrated an understanding of the instructions.  A copy of instructions were sent to the patient via MyChart unless otherwise noted below.     The patient was advised to call back or seek an in-person evaluation if the symptoms worsen or if the condition fails to improve as anticipated.  Time:  I spent 10 minutes with the patient via telehealth technology discussing the above problems/concerns.    Dellia Nims, FNP

## 2023-03-10 ENCOUNTER — Other Ambulatory Visit: Payer: Self-pay

## 2023-03-10 ENCOUNTER — Emergency Department (HOSPITAL_COMMUNITY)
Admission: EM | Admit: 2023-03-10 | Discharge: 2023-03-10 | Disposition: A | Discharge status: Home / Self Care | Payer: Self-pay | Attending: Emergency Medicine | Admitting: Emergency Medicine

## 2023-03-10 ENCOUNTER — Encounter (HOSPITAL_COMMUNITY): Payer: Self-pay

## 2023-03-10 ENCOUNTER — Emergency Department (HOSPITAL_COMMUNITY): Payer: Self-pay

## 2023-03-10 DIAGNOSIS — N760 Acute vaginitis: Secondary | ICD-10-CM | POA: Insufficient documentation

## 2023-03-10 DIAGNOSIS — B9689 Other specified bacterial agents as the cause of diseases classified elsewhere: Secondary | ICD-10-CM | POA: Insufficient documentation

## 2023-03-10 DIAGNOSIS — Z20822 Contact with and (suspected) exposure to covid-19: Secondary | ICD-10-CM | POA: Insufficient documentation

## 2023-03-10 DIAGNOSIS — J069 Acute upper respiratory infection, unspecified: Secondary | ICD-10-CM | POA: Insufficient documentation

## 2023-03-10 DIAGNOSIS — J45909 Unspecified asthma, uncomplicated: Secondary | ICD-10-CM | POA: Insufficient documentation

## 2023-03-10 DIAGNOSIS — B9789 Other viral agents as the cause of diseases classified elsewhere: Secondary | ICD-10-CM | POA: Insufficient documentation

## 2023-03-10 LAB — RESP PANEL BY RT-PCR (RSV, FLU A&B, COVID)  RVPGX2
Influenza A by PCR: NEGATIVE
Influenza B by PCR: NEGATIVE
Resp Syncytial Virus by PCR: NEGATIVE
SARS Coronavirus 2 by RT PCR: NEGATIVE

## 2023-03-10 LAB — WET PREP, GENITAL
Sperm: NONE SEEN
Trich, Wet Prep: NONE SEEN
WBC, Wet Prep HPF POC: <10 (ref <10)
Yeast Wet Prep HPF POC: NONE SEEN

## 2023-03-10 MED ORDER — IPRATROPIUM-ALBUTEROL 0.5-2.5 (3) MG/3ML IN SOLN
3.0000 mL | Freq: Once | RESPIRATORY_TRACT | Status: AC
Start: 2023-03-10 — End: 2023-03-10
  Administered 2023-03-10: 3 mL via RESPIRATORY_TRACT
  Filled 2023-03-10: qty 3

## 2023-03-10 MED ORDER — METRONIDAZOLE 500 MG PO TABS: 500.0000 mg | ORAL_TABLET | Freq: Two times a day (BID) | ORAL | 0 refills | Status: DC

## 2023-03-10 MED ORDER — ALBUTEROL SULFATE HFA 108 (90 BASE) MCG/ACT IN AERS: 1.0000 | INHALATION_SPRAY | Freq: Four times a day (QID) | RESPIRATORY_TRACT | 0 refills | Status: DC | PRN

## 2023-03-10 NOTE — Discharge Instructions (Signed)
Thank you for coming to [facility]. You were seen for chest tightness and vaginal discharge. We did an exam, labs, and imaging, and these showed no acute findings.  Your symptoms improved after nebulizer.  Please use your inhaler scheduled every 4-6 hours for several days and then as needed after that.  Please follow-up with your primary care physician within 1 week by your asthma.  For your vaginal discharge she was tested positive for bacterial vaginosis which is a bacterial overgrowth in the vagina.  We have prescribed Flagyl 500 mg twice per day for 7 days.  Please do not drink alcohol while you take this medication as it will make you sick.  Please follow up with your primary care provider within 1 week.   Do not hesitate to return to the ED or call 911 if you experience: -Worsening symptoms -Shortness of breath, chest pain -Lightheadedness, passing out -Fevers/chills -Anything else that concerns you

## 2023-03-10 NOTE — ED Triage Notes (Signed)
Pt came in via pOV d/t having an asthma attack yesterday & her inhaler did not help & her nebulizer only helped a little. Also reports yesterday she had a fever, & her body has been aching feeling nauseous & cramping in her abd from coughing. A/Ox4, rates pain 8/10, & some lingering chest tightness.

## 2023-03-10 NOTE — ED Provider Notes (Signed)
[facility] EMERGENCY DEPARTMENT AT [facility] Provider Note   CSN: [id number] Arrival date & time: 03/10/23  0745     History  Chief Complaint  Patient presents with   Asthma Attack    Kara Neal is a 22 y.o. female with asthma, who presents with asthma, chest tightness.   Presents POV with concerns for asthma attack.  Patient been having symptoms of nasal congestion cough and 1 episode of nausea vomiting that started yesterday.  She denies any shortness of breath but endorses chest tightness that she associates with her asthma.  She states that she does not use any asthma treatments daily but has a nebulizer for when she has asthma attacks and this has helped in the past. She has not used anything for her asthma since this chest tightness started. She denies productive cough but states she was wheezing this AM. Endorses one episode of fever that happened yesterday. Otherwise feels well. Requests a nebulizer today for her asthma.  HPI     Home Medications Prior to Admission medications   Medication Sig Start Date End Date Taking? Authorizing Provider  metroNIDAZOLE (FLAGYL) 500 MG tablet Take 1 tablet (500 mg total) by mouth 2 (two) times daily. 03/10/23  Yes Loetta Rough, MD  albuterol (PROVENTIL) (2.5 MG/3ML) 0.083% nebulizer solution Take 3 mLs (2.5 mg total) by nebulization every 6 (six) hours as needed for wheezing or shortness of breath. 02/05/22   Raspet, Noberto Retort, PA-C  albuterol (VENTOLIN HFA) 108 (90 Base) MCG/ACT inhaler Inhale 1-2 puffs into the lungs every 6 (six) hours as needed for wheezing or shortness of breath. 03/10/23   Loetta Rough, MD  ondansetron (ZOFRAN-ODT) 4 MG disintegrating tablet Take 1 tablet (4 mg total) by mouth every 8 (eight) hours as needed for nausea or vomiting. 10/11/22   Mare Ferrari, PA-C      Allergies    Patient has no known allergies.    Review of Systems   Review of Systems Review of systems Positive for fever  yesterday.  A 10 point review of systems was performed and is negative unless otherwise reported in HPI.  Physical Exam Updated Vital Signs BP 112/74   Pulse 72   Temp 98.2 F (36.8 C) (Oral)   Resp 20   SpO2 100%  Physical Exam General: Normal appearing female, lying in bed.  HEENT: PERRLA, Sclera anicteric, MMM, trachea midline.  Cardiology: RRR, no murmurs/rubs/gallops. BL radial and DP pulses equal bilaterally.  Resp: Normal respiratory rate and effort. CTAB, no wheezes, rhonchi, crackles.  Abd: Soft, non-tender, non-distended. No rebound tenderness or guarding.  GU: Deferred. MSK: No peripheral edema or signs of trauma. Extremities without deformity or TTP. No cyanosis or clubbing. Skin: warm, dry. No rashes or lesions. Back: No CVA tenderness Neuro: A&Ox4, CNs II-XII grossly intact. MAEs. Sensation grossly intact.  Psych: Normal mood and affect.   ED Results / Procedures / Treatments   Labs (all labs ordered are listed, but only abnormal results are displayed) Labs Reviewed  WET PREP, GENITAL - Abnormal; Notable for the following components:      Result Value   Clue Cells Wet Prep HPF POC PRESENT (*)    All other components within normal limits  RESP PANEL BY RT-PCR (RSV, FLU A&B, COVID)  [id number]  GC/CHLAMYDIA PROBE AMP ([place]) NOT AT [facility]    EKG EKG Interpretation  Date/Time:  Wednesday March 10 2023 09:20:28 EDT Ventricular Rate:  73 PR Interval:  160  QRS Duration: 75 QT Interval:  358 QTC Calculation: 395 R Axis:   71 Text Interpretation: Sinus rhythm Confirmed by Vivi Barrack [phone]) on 03/10/2023 9:33:30 AM  Radiology DG Chest 2 View  Result Date: 03/10/2023 CLINICAL DATA:  cp EXAM: CHEST - 2 VIEW COMPARISON:  10/11/2022 FINDINGS: Cardiac silhouette is unremarkable. No pneumothorax or pleural effusion. The lungs are clear. The visualized skeletal structures are unremarkable. IMPRESSION: No acute cardiopulmonary process. Electronically Signed   By:  Layla Maw M.D.   On: 03/10/2023 08:28    Procedures Procedures    Medications Ordered in ED Medications  ipratropium-albuterol (DUONEB) 0.5-2.5 (3) MG/3ML nebulizer solution 3 mL (3 mLs Nebulization Given 03/10/23 0981)    ED Course/ Medical Decision Making/ A&P                          Medical Decision Making Amount and/or Complexity of Data Reviewed Labs: ordered. Decision-making details documented in ED Course. Radiology: ordered. Decision-making details documented in ED Course.  Risk Prescription drug management.    This patient presents to the ED for concern of chest tightness, this involves an extensive number of treatment options, and is a complaint that carries with it a high risk of complications and morbidity.  However patient is very well-appearing and hemodynamically stable.  MDM:    Patient presenting for concerns for chest tightness that she associates with her asthma.  She has upper respiratory symptoms including cough and nasal congestion as well as fever yesterday likely due to a upper respiratory viral illness, likely setting of her asthma symptoms.  She states she was wheezing this morning which she is not wheezing now.  She is in no respiratory distress, no tachypnea or hypoxia.  She has no lower EXTR edema or signs or symptoms of DVT to indicate PE and she percs out.  Very low concern for ACS or dissection given report of symptoms. No PNA, PTX, or pulm edema/pleural effusion on CXR.  Most likely patient has chest tightness due to asthma in s/o viral URI and will give nebulizer which is what patient requested and reassess. No wheezing, do not believe she is having of 1 asthma exacerbation, do not believe she needs IV or p.o. steroids at this time.   Clinical Course as of 03/10/23 1253  Wed Mar 10, 2023  0850 DG Chest 2 View FINDINGS: Cardiac silhouette is unremarkable. No pneumothorax or pleural effusion. The lungs are clear. The visualized skeletal  structures are unremarkable.  IMPRESSION: No acute cardiopulmonary process.   [HN]  1040 Resp panel by RT-PCR (RSV, Flu A&B, Covid) Anterior Nasal Swab Neg [HN]  1205 Clue Cells Wet Prep HPF POC(!): PRESENT Will treat for BV [HN]    Clinical Course User Index [HN] Loetta Rough, MD    On reevaluation patient felt improved after the nebulizer and stated her chest tightness was much less.  I discussed with patient that she should use her inhaler even though she thinks the nebulizer works better as the inhaler probably does help as well.  I will refill her inhaler and encouraged her to use it every 4-6 hours scheduled for the next 2 to 3 days and then as needed after that.  I encouraged her to follow-up with her PCP about her asthma treatment.  On the way out the door patient also mention she is having some vaginal discharge.  She says she has not had any recent unprotected sex but would like  STD testing anyway.  She denies any abdominal pain or vaginal bleeding.  She denies wanting a pregnancy test.  I offered a pelvic exam but she states she would rather self swab.  Patient on the self swab positive for clue cells, likely BV.  Will treat her with Flagyl x 1 week and encouraged her to follow-up with her PCP.  Patient states she can follow-up the G/C results online on her MyChart.  If she has not had any recent unprotected sex I do not believe that she needs presumptive treatment.     Labs: I Ordered, and personally interpreted labs.  The pertinent results include:  those listed above  Imaging Studies ordered: I ordered imaging studies including chest x-ray I independently visualized and interpreted imaging. I agree with the radiologist interpretation  Additional history obtained from chart review.    Reevaluation: After the interventions noted above, I reevaluated the patient and found that they have :improved  Social Determinants of Health: Patient lives  independently   Disposition:  DC w/ discharge instructions/return precautions. All questions answered to patient's satisfaction.  Albuterol inhaler rx and flagyl rx   Co morbidities that complicate the patient evaluation  Past Medical History:  Diagnosis Date   Asthma      Medicines Meds ordered this encounter  Medications   ipratropium-albuterol (DUONEB) 0.5-2.5 (3) MG/3ML nebulizer solution 3 mL   albuterol (VENTOLIN HFA) 108 (90 Base) MCG/ACT inhaler    Sig: Inhale 1-2 puffs into the lungs every 6 (six) hours as needed for wheezing or shortness of breath.    Dispense:  8 g    Refill:  0   metroNIDAZOLE (FLAGYL) 500 MG tablet    Sig: Take 1 tablet (500 mg total) by mouth 2 (two) times daily.    Dispense:  14 tablet    Refill:  0    I have reviewed the patients home medicines and have made adjustments as needed  Problem List / ED Course: Problem List Items Addressed This Visit   None Visit Diagnoses     Uncomplicated asthma, unspecified asthma severity, unspecified whether persistent    -  Primary   Relevant Medications   ipratropium-albuterol (DUONEB) 0.5-2.5 (3) MG/3ML nebulizer solution 3 mL (Completed)   albuterol (VENTOLIN HFA) 108 (90 Base) MCG/ACT inhaler   Bacterial vaginosis       Relevant Medications   metroNIDAZOLE (FLAGYL) 500 MG tablet   Upper respiratory virus       Relevant Medications   metroNIDAZOLE (FLAGYL) 500 MG tablet                   This note was created using dictation software, which may contain spelling or grammatical errors.    Loetta Rough, MD 03/10/23 1256

## 2023-03-11 ENCOUNTER — Encounter (HOSPITAL_COMMUNITY): Payer: Self-pay | Admitting: Emergency Medicine

## 2023-03-11 ENCOUNTER — Emergency Department (HOSPITAL_COMMUNITY)
Admission: EM | Admit: 2023-03-11 | Discharge: 2023-03-12 | Disposition: A | Discharge status: Home / Self Care | Payer: BLUE CROSS/BLUE SHIELD | Attending: Emergency Medicine | Admitting: Emergency Medicine

## 2023-03-11 ENCOUNTER — Other Ambulatory Visit: Payer: Self-pay

## 2023-03-11 ENCOUNTER — Emergency Department (HOSPITAL_COMMUNITY): Payer: BLUE CROSS/BLUE SHIELD

## 2023-03-11 ENCOUNTER — Telehealth: Payer: Self-pay | Admitting: Family Medicine

## 2023-03-11 DIAGNOSIS — J4531 Mild persistent asthma with (acute) exacerbation: Secondary | ICD-10-CM | POA: Insufficient documentation

## 2023-03-11 DIAGNOSIS — E876 Hypokalemia: Secondary | ICD-10-CM | POA: Diagnosis not present

## 2023-03-11 DIAGNOSIS — A549 Gonococcal infection, unspecified: Secondary | ICD-10-CM | POA: Insufficient documentation

## 2023-03-11 DIAGNOSIS — J45909 Unspecified asthma, uncomplicated: Secondary | ICD-10-CM

## 2023-03-11 DIAGNOSIS — R062 Wheezing: Secondary | ICD-10-CM | POA: Diagnosis present

## 2023-03-11 LAB — CBC
HCT: 37 % (ref 36.0–46.0)
Hemoglobin: 12.6 g/dL (ref 12.0–15.0)
MCH: 31 pg (ref 26.0–34.0)
MCHC: 34.1 g/dL (ref 30.0–36.0)
MCV: 90.9 fL (ref 80.0–100.0)
Platelets: 234 10*3/uL (ref 150–400)
RBC: 4.07 MIL/uL (ref 3.87–5.11)
RDW: 12 % (ref 11.5–15.5)
WBC: 5.9 10*3/uL (ref 4.0–10.5)
nRBC: 0 % (ref 0.0–0.2)

## 2023-03-11 LAB — BASIC METABOLIC PANEL
Anion gap: 12 (ref 5–15)
BUN: 7 mg/dL (ref 6–20)
CO2: 24 mmol/L (ref 22–32)
Calcium: 9.5 mg/dL (ref 8.9–10.3)
Chloride: 106 mmol/L (ref 98–111)
Creatinine, Ser: 0.83 mg/dL (ref 0.44–1.00)
GFR, Estimated: >60 mL/min (ref >60)
Glucose, Bld: 83 mg/dL (ref 70–99)
Potassium: 3 mmol/L — ABNORMAL LOW (ref 3.5–5.1)
Sodium: 142 mmol/L (ref 135–145)

## 2023-03-11 LAB — TROPONIN I (HIGH SENSITIVITY)
Troponin I (High Sensitivity): 3 ng/L (ref <18)
Troponin I (High Sensitivity): <2 ng/L (ref <18)

## 2023-03-11 LAB — GC/CHLAMYDIA PROBE AMP (~~LOC~~) NOT AT ARMC
Chlamydia: NEGATIVE
Comment: NEGATIVE
Comment: NORMAL
Neisseria Gonorrhea: POSITIVE — AB

## 2023-03-11 MED ORDER — ALBUTEROL SULFATE HFA 108 (90 BASE) MCG/ACT IN AERS
2.0000 | INHALATION_SPRAY | Freq: Once | RESPIRATORY_TRACT | Status: AC
  Administered 2023-03-11: 2 via RESPIRATORY_TRACT
  Filled 2023-03-11: qty 6.7

## 2023-03-11 NOTE — ED Provider Triage Note (Signed)
Emergency Medicine Provider Triage Evaluation Note  Kara Neal , a 22 y.o. female  was evaluated in triage.  Pt complains of chest pain shortness of breath.  History of asthma.  Chest pain started a week ago.  It is midsternal on times radiates to the back.  Feels sharp.  It is associated shortness of breath.  Also endorses nonproductive cough is worse at night and fever 2 days ago.  Was seen yesterday for asthma, discharged with albuterol and follow-up with PCP.  States that overnight her chest pain, shortness of breath and cough has gotten worse since she is certain that her asthma is getting worse as well.  Review of Systems  Positive: See above Negative: See above  Physical Exam  BP 125/80 (BP Location: Right Arm)   Pulse 85   Temp 98.6 F (37 C) (Oral)   Resp 18   SpO2 100%  Gen:   Awake, no distress   Resp:  Normal effort, diffuse coarse breath sounds with wheezing  MSK:   Moves extremities without difficulty  Other:    Medical Decision Making  Medically screening exam initiated at 5:16 PM.  Appropriate orders placed.  Kara Neal was informed that the remainder of the evaluation will be completed by another provider, this initial triage assessment does not replace that evaluation, and the importance of remaining in the ED until their evaluation is complete.  Work up started   Gareth Eagle, PA-C 03/11/23 1718

## 2023-03-11 NOTE — ED Triage Notes (Signed)
Pt c/o mid sternal chest pain that started last week worsening today. Seen yesterday for asthma with treatment but states her shob has worsened also. Endorses productive cough and fever 2 days ago.

## 2023-03-11 NOTE — Progress Notes (Signed)
Needs work note from being in ED yesterday due to asthma attack. Reports still feeling chest tightness and wheezing.  Advised that she should return to the ED for both an assessment of changes and work note.  Patient acknowledged agreement and understanding of the plan.

## 2023-03-12 MED ORDER — IPRATROPIUM-ALBUTEROL 0.5-2.5 (3) MG/3ML IN SOLN
3.0000 mL | Freq: Once | RESPIRATORY_TRACT | Status: AC
  Administered 2023-03-12: 3 mL via RESPIRATORY_TRACT
  Filled 2023-03-12: qty 3

## 2023-03-12 MED ORDER — AZITHROMYCIN 500 MG PO TABS: 2000.0000 mg | ORAL_TABLET | Freq: Once | ORAL | 0 refills | Status: AC

## 2023-03-12 MED ORDER — GENTAMICIN SULFATE 40 MG/ML IJ SOLN
240.0000 mg | INTRAMUSCULAR | Status: AC
  Administered 2023-03-12: 240 mg via INTRAMUSCULAR
  Filled 2023-03-12: qty 6

## 2023-03-12 MED ORDER — PREDNISONE 20 MG PO TABS: 40.0000 mg | ORAL_TABLET | Freq: Every day | ORAL | 0 refills | Status: DC

## 2023-03-12 MED ORDER — ONDANSETRON HCL 4 MG/2ML IJ SOLN
4.0000 mg | Freq: Once | INTRAMUSCULAR | Status: AC
  Administered 2023-03-12: 4 mg via INTRAVENOUS
  Filled 2023-03-12: qty 2

## 2023-03-12 MED ORDER — POTASSIUM CHLORIDE CRYS ER 20 MEQ PO TBCR
40.0000 meq | EXTENDED_RELEASE_TABLET | Freq: Once | ORAL | Status: AC
  Administered 2023-03-12: 40 meq via ORAL
  Filled 2023-03-12: qty 2

## 2023-03-12 MED ORDER — METHYLPREDNISOLONE SODIUM SUCC 125 MG IJ SOLR
125.0000 mg | Freq: Once | INTRAMUSCULAR | Status: AC
  Administered 2023-03-12: 125 mg via INTRAVENOUS
  Filled 2023-03-12: qty 2

## 2023-03-12 MED ORDER — AZITHROMYCIN 250 MG PO TABS
2000.0000 mg | ORAL_TABLET | ORAL | Status: AC
  Administered 2023-03-12: 2000 mg via ORAL
  Filled 2023-03-12: qty 8

## 2023-03-12 NOTE — Discharge Instructions (Addendum)
You were seen in the ER today for evaluation of your asthma and gonorrhea results.  Glad you are feeling better after the IV steroids and breathing treatments.  I have sent you home with some steroids to help with your asthma symptoms.  Please make sure you are taking your albuterol inhaler as needed.  I recommend taking Zyrtec or Claritin daily to help with your allergies as well.  Please make sure you follow-up in person with your primary care doctor for possible need for long-term daily inhalers to help with your asthma.  Additionally, given that you thrown up the medication for gonorrhea, I am sending you home with a dose.  Please take this later today/tomorrow with food.  Make sure you follow with the health department or your primary care doctor for retesting to see if he cleared the gonorrhea.  If you have any concerns, new or worsening symptoms, please return to the nearest emergency room for evaluation.  *Additionally, your potassium was low on your labs. Please make sure you follow up with your PCP for recheck of this in the next few days.   Contact a doctor if: You have wheezing, shortness of breath, or a cough even while taking medicine to prevent attacks. The mucus you cough up (sputum) is thicker than usual. The mucus you cough up changes from clear or white to yellow, green, gray, or is bloody. You have problems from the medicine you are taking, such as: A rash. Itching. Swelling. Trouble breathing. You need reliever medicines more than 2-3 times a week. Your peak flow reading is still at 50-79% of your personal best after following the action plan for 1 hour. You have a fever. Get help right away if: You seem to be worse and are not responding to medicine during an asthma attack. You are short of breath even at rest. You get short of breath when doing very little activity. You have trouble eating, drinking, or talking. You have chest pain or tightness. You have a fast  heartbeat. Your lips or fingernails start to turn blue. You are light-headed or dizzy, or you faint. Your peak flow is less than 50% of your personal best. You feel too tired to breathe normally. These symptoms may be an emergency. Get help right away. Call 911. Do not wait to see if the symptoms will go away. Do not drive yourself to the hospital.

## 2023-03-12 NOTE — ED Provider Notes (Signed)
[place] EMERGENCY DEPARTMENT AT [facility] Provider Note   CSN: [id number] Arrival date & time: 03/11/23  1645     History  Chief Complaint  Patient presents with   Chest Pain    Kara Neal is a 22 y.o. female with h/o asthma presents to the ER for evaluation of persistent chest tightness and wheezing.  She reports that she feels like she cannot work because she feels short of breath with more vigorous activity.  She was seen here for asthma exacerbation yesterday was given a breathing treatment.  She reports that she only felt a little better after that but she feels like it has been worse and she has been at work again today.  She denies any chest pain.  She reports that she has been having a runny nose and nasal congestion.  She had a reported fever a few days ago that self resolved without any medications taken.  She was also wondering about the positive gonorrhea result that she had.  She reports she has not been sexually active since the last time she had gonorrhea and was wondering why she still had it.  She reports that she has been using her albuterol inhaler and nebulizer with only minimal improvement.  She denies any abdominal pain, nausea, vomiting, diarrhea.  Denies any dysuria or hematuria.  No known drug allergies.   Chest Pain Associated symptoms: shortness of breath   Associated symptoms: no abdominal pain, no headache, no nausea and no vomiting        Home Medications Prior to Admission medications   Medication Sig Start Date End Date Taking? Authorizing Provider  albuterol (PROVENTIL) (2.5 MG/3ML) 0.083% nebulizer solution Take 3 mLs (2.5 mg total) by nebulization every 6 (six) hours as needed for wheezing or shortness of breath. 02/05/22   Raspet, Noberto Retort, PA-C  albuterol (VENTOLIN HFA) 108 (90 Base) MCG/ACT inhaler Inhale 1-2 puffs into the lungs every 6 (six) hours as needed for wheezing or shortness of breath. 03/10/23   Loetta Rough, MD   metroNIDAZOLE (FLAGYL) 500 MG tablet Take 1 tablet (500 mg total) by mouth 2 (two) times daily. 03/10/23   Loetta Rough, MD  ondansetron (ZOFRAN-ODT) 4 MG disintegrating tablet Take 1 tablet (4 mg total) by mouth every 8 (eight) hours as needed for nausea or vomiting. 10/11/22   Mare Ferrari, PA-C      Allergies    Patient has no known allergies.    Review of Systems   Review of Systems  Constitutional:  Negative for chills.  HENT:  Positive for congestion and rhinorrhea.   Respiratory:  Positive for chest tightness, shortness of breath and wheezing.   Cardiovascular:  Negative for chest pain.  Gastrointestinal:  Negative for abdominal pain, diarrhea, nausea and vomiting.  Genitourinary:  Negative for dysuria and hematuria.  Neurological:  Negative for headaches.    Physical Exam Updated Vital Signs BP 119/87 (BP Location: Right Arm)   Pulse 60   Temp 98.9 F (37.2 C) (Oral)   Resp 18   Ht 5\' 7"  (1.702 m)   Wt 65.8 kg   SpO2 100%   BMI 22.71 kg/m  Physical Exam Vitals and nursing note reviewed.  Constitutional:      General: She is not in acute distress.    Appearance: Normal appearance. She is not ill-appearing or toxic-appearing.  HENT:     Head: Normocephalic and atraumatic.  Eyes:     General: No scleral icterus. Cardiovascular:  Rate and Rhythm: Normal rate and regular rhythm.  Pulmonary:     Effort: Pulmonary effort is normal. No respiratory distress.     Breath sounds: Wheezing and rhonchi present.     Comments: Speaking in full sentences with ease, satting well on room air without increased work of breathing.  She does have some coarse rhonchi to bilateral lung fields with end expiratory wheezing.  No tripoding, accessory muscle use, nasal flaring, cyanosis, or respiratory distress appreciated. Abdominal:     General: Abdomen is flat.  Musculoskeletal:        General: No deformity.     Cervical back: Normal range of motion.  Skin:    General: Skin  is warm and dry.  Neurological:     General: No focal deficit present.     Mental Status: She is alert. Mental status is at baseline.     ED Results / Procedures / Treatments   Labs (all labs ordered are listed, but only abnormal results are displayed) Labs Reviewed  BASIC METABOLIC PANEL - Abnormal; Notable for the following components:      Result Value   Potassium 3.0 (*)    All other components within normal limits  CBC  TROPONIN I (HIGH SENSITIVITY)  TROPONIN I (HIGH SENSITIVITY)    EKG None  Radiology DG Chest 1 View  Result Date: 03/11/2023 CLINICAL DATA:  [id number] Chest pain 244010 EXAM: CHEST  1 VIEW COMPARISON:  03/10/2023 FINDINGS: Cardiac silhouette is unremarkable. No pneumothorax or pleural effusion. The lungs are clear. The visualized skeletal structures are unremarkable. IMPRESSION: No acute cardiopulmonary process. Electronically Signed   By: Layla Maw M.D.   On: 03/11/2023 17:59   DG Chest 2 View  Result Date: 03/10/2023 CLINICAL DATA:  cp EXAM: CHEST - 2 VIEW COMPARISON:  10/11/2022 FINDINGS: Cardiac silhouette is unremarkable. No pneumothorax or pleural effusion. The lungs are clear. The visualized skeletal structures are unremarkable. IMPRESSION: No acute cardiopulmonary process. Electronically Signed   By: Layla Maw M.D.   On: 03/10/2023 08:28    Procedures Procedures   Medications Ordered in ED Medications  ipratropium-albuterol (DUONEB) 0.5-2.5 (3) MG/3ML nebulizer solution 3 mL (has no administration in time range)  azithromycin (ZITHROMAX) tablet 2,000 mg (has no administration in time range)  gentamicin (GARAMYCIN) injection 240 mg (has no administration in time range)  albuterol (VENTOLIN HFA) 108 (90 Base) MCG/ACT inhaler 2 puff (2 puffs Inhalation Given 03/11/23 1722)  methylPREDNISolone sodium succinate (SOLU-MEDROL) 125 mg/2 mL injection 125 mg (125 mg Intravenous Given 03/12/23 0229)  potassium chloride SA (KLOR-CON M) CR tablet 40  mEq (40 mEq Oral Given 03/12/23 0227)    ED Course/ Medical Decision Making/ A&P                           Medical Decision Making Risk Prescription drug management.   22 y.o. female presents to the ER for evaluation of shortness of breath and wheezing. Differential diagnosis includes but is not limited to asthma exacerbation, PE, ACS, bronchitis, pneumonia, pneumothorax. Vital signs unremarkable. Physical exam as noted above.   Labs and imaging were ordered in triage.  On chart evaluation, patient was seen here yesterday and received a DuoNeb.  She was negative for COVID, flu, RSV.  She had clue cells present and was treated for BV.  She also ended up being positive for gonorrhea as well.  I independently reviewed and interpreted the patient's labs.  CBC without leukocytosis or  anemia.  BMP shows mildly decreased potassium at 3.0 otherwise no electrolyte abnormalities.  Initial troponin 3 with repeat at less than 2.  Chest x-ray shows no acute cardiopulmonary process.  EKG read and interpreted by attending and read as normal sinus rhythm.  I see the patient was seen in November 2023 and she received Rocephin 500 mg IM.  She is adamant that she has not had any sexual intercourse since then and does not know why she is positive for gonorrhea.  I did reach out to infectious disease with Dr. Luciana Axe.  He does not think this needs to be reported to the [facility] or health department.  He recommends that he could do gentamicin and azithromycin for her symptoms instead of the Rocephin.  Will trial that.  Solu-Medrol and DuoNeb ordered.  Given the patient has had repeat visits, will order IV steroids given her course breath sounds and wheezing.  On reevaluation, patient ports she is feeling much better.  She reports that her chest tightness has subsided and she no longer can feel herself wheezing.  On reevaluation of her lungs, she does have some slight wheezing the bilateral lower bases, but  significant improved from her previous on exam.  She reports that she thinks she may have started the azithromycin that she was given as she threw up shortly after taking this.  I will send her home with 2 g to take with food later in the day.  I spoke to pharmacy confirms that this is okay.  After consideration of the diagnostic results and the patients response to treatment, I feel that the patient can be discharged home.  She is PERC negative.  Does not have any lower extremity edema or any unilateral leg swelling that she is complaining of.  Location of ACS given age as well as normal troponins and reassuring EKG.  Chest x-ray again is reassuring does not show any signs of pneumonia or pneumothorax.  I do not see a need to repeat viral testing as this was done yesterday and has not had any new symptoms.  She is improved after DuoNebs and Solu-Medrol.  Will give her a work note and recommended that she use her albuterol inhaler as needed and taken daily allergy medication such as Zyrtec or Claritin to help with her symptoms.  Recommended follow-up with her PCP for reevaluation of her hypokalemia.  We discussed the results of the labs/imaging. The plan is follow-up PCP for possible LABA and f/u with [facility] labs. Take prednisone as prescribed.  Take azithromycin with food and follow-up with health department for test of cure.. We discussed strict return precautions and red flag symptoms. The patient verbalized their understanding and agrees to the plan. The patient is stable and being discharged home in good condition.  Portions of this report may have been transcribed using voice recognition software. Every effort was made to ensure accuracy; however, inadvertent computerized transcription errors may be present.   Final Clinical Impression(s) / ED Diagnoses Final diagnoses:  Mild persistent asthma with exacerbation  Gonorrhea  Hypokalemia    Rx / DC Orders ED Discharge Orders          Ordered     azithromycin (ZITHROMAX) 500 MG tablet   Once        03/12/23 0549    predniSONE (DELTASONE) 20 MG tablet  Daily        03/12/23 0549              Achille Rich,  PA-C 03/12/23 0601    Gilda Crease, MD 03/12/23 [phone]

## 2023-03-27 ENCOUNTER — Ambulatory Visit (HOSPITAL_COMMUNITY): Payer: Self-pay

## 2023-03-28 ENCOUNTER — Encounter (HOSPITAL_COMMUNITY): Payer: Self-pay

## 2023-03-28 ENCOUNTER — Ambulatory Visit (HOSPITAL_COMMUNITY)
Admission: RE | Admit: 2023-03-28 | Discharge: 2023-03-28 | Disposition: A | Discharge status: Home / Self Care | Payer: BLUE CROSS/BLUE SHIELD | Source: Ambulatory Visit | Attending: Family Medicine | Admitting: Family Medicine

## 2023-03-28 VITALS — BP 108/67 | HR 85 | Temp 98.8°F | Resp 16 | Ht 67.0 in | Wt 140.0 lb

## 2023-03-28 DIAGNOSIS — N760 Acute vaginitis: Secondary | ICD-10-CM

## 2023-03-28 LAB — POCT URINE PREGNANCY: Preg Test, Ur: NEGATIVE

## 2023-03-28 MED ORDER — CEFTRIAXONE SODIUM 500 MG IJ SOLR
INTRAMUSCULAR | Status: AC
  Filled 2023-03-28: qty 500

## 2023-03-28 MED ORDER — METRONIDAZOLE 500 MG PO TABS: 500.0000 mg | ORAL_TABLET | Freq: Two times a day (BID) | ORAL | 0 refills | Status: DC

## 2023-03-28 MED ORDER — CEFTRIAXONE SODIUM 500 MG IJ SOLR
500.0000 mg | INTRAMUSCULAR | Status: DC
  Administered 2023-03-28: 500 mg via INTRAMUSCULAR

## 2023-03-28 NOTE — ED Provider Notes (Signed)
[facility]    CSN: [id number] Arrival date & time: 03/28/23  1512      History   Chief Complaint Chief Complaint  Patient presents with   Exposure to STD    I had a appointment on April 14 and got the meds to stop my gonorrhea symptoms but I still seeing discharge from that appointment and wanted to do a swab and get cured again also got tested for bv and took my meds for that too I'm scared don't know - Entered by patient    HPI Kara Neal is a 22 y.o. female.    Exposure to STD   Here for vaginal discharge.  Started again in the last week or so.  She was seen in the emergency room for other issues in mid April and had STD testing.  She was positive for gonorrhea.  She also was treated for BV.  She states that the discharge at that time improved while she was on the treatment, but now it has returned.  No fever and no abdominal pain.  No dysuria.  Last menstrual cycle was 3 months ago.  She has a Nexplanon, but she reveals to me today that is expired was placed over 3 years ago and was placed over 3 years ago When she was seen in the emergency room she was adamant that she had not had intercourse since she was last treated for gonorrhea, so they treated her with azithromycin and gentamicin.            Past Medical History:  Diagnosis Date   Asthma     There are no problems to display for this patient.   History reviewed. No pertinent surgical history.  OB History   No obstetric history on file.      Home Medications    Prior to Admission medications   Medication Sig Start Date End Date Taking? Authorizing Provider  albuterol (PROVENTIL) (2.5 MG/3ML) 0.083% nebulizer solution Take 3 mLs (2.5 mg total) by nebulization every 6 (six) hours as needed for wheezing or shortness of breath. 02/05/22  Yes Raspet, Erin K, PA-C  albuterol (VENTOLIN HFA) 108 (90 Base) MCG/ACT inhaler Inhale 1-2 puffs into the lungs every 6 (six) hours as needed for  wheezing or shortness of breath. 03/10/23  Yes Loetta Rough, MD  metroNIDAZOLE (FLAGYL) 500 MG tablet Take 1 tablet (500 mg total) by mouth 2 (two) times daily. 03/28/23   Zenia Resides, MD    Family History Family History  Problem Relation Age of Onset   Asthma Father     Social History Social History   Tobacco Use   Smoking status: Never   Smokeless tobacco: Never  Vaping Use   Vaping Use: Never used  Substance Use Topics   Alcohol use: Never   Drug use: Never     Allergies   Patient has no known allergies.   Review of Systems Review of Systems   Physical Exam Triage Vital Signs ED Triage Vitals [03/28/23 1606]  Enc Vitals Group     BP 108/67     Pulse Rate 85     Resp 16     Temp 98.8 F (37.1 C)     Temp Source Oral     SpO2 98 %     Weight 140 lb (63.5 kg)     Height 5\' 7"  (1.702 m)     Head Circumference      Peak Flow  Pain Score 0     Pain Loc      Pain Edu?      Excl. in GC?    No data found.  Updated Vital Signs BP 108/67 (BP Location: Right Arm)   Pulse 85   Temp 98.8 F (37.1 C) (Oral)   Resp 16   Ht 5\' 7"  (1.702 m)   Wt 63.5 kg   SpO2 98%   BMI 21.93 kg/m   Visual Acuity Right Eye Distance:   Left Eye Distance:   Bilateral Distance:    Right Eye Near:   Left Eye Near:    Bilateral Near:     Physical Exam Vitals reviewed.  Constitutional:      General: She is not in acute distress.    Appearance: She is not ill-appearing, toxic-appearing or diaphoretic.  Cardiovascular:     Rate and Rhythm: Normal rate and regular rhythm.  Pulmonary:     Effort: Pulmonary effort is normal.     Breath sounds: Normal breath sounds.  Abdominal:     Palpations: Abdomen is soft.     Tenderness: There is no abdominal tenderness.  Skin:    Coloration: Skin is not jaundiced or pale.  Neurological:     General: No focal deficit present.     Mental Status: She is alert and oriented to person, place, and time.  Psychiatric:         Behavior: Behavior normal.      UC Treatments / Results  Labs (all labs ordered are listed, but only abnormal results are displayed) Labs Reviewed  POCT URINE PREGNANCY  CERVICOVAGINAL ANCILLARY ONLY    EKG   Radiology No results found.  Procedures Procedures (including critical care time)  Medications Ordered in UC Medications  cefTRIAXone (ROCEPHIN) injection 500 mg (has no administration in time range)    Initial Impression / Assessment and Plan / UC Course  I have reviewed the triage vital signs and the nursing notes.  Pertinent labs & imaging results that were available during my care of the patient were reviewed by me and considered in my medical decision making (see chart for details).          UPT is negative.  Per her wishes she is treated with Rocephin and metronidazole prescription is sent today.  Staff will notify her of any positives on the swab and treat per protocol.  She is given information on the regional center for infectious diseases, to follow-up with them.  Final Clinical Impressions(s) / UC Diagnoses   Final diagnoses:  Acute vaginitis     Discharge Instructions      Pregnancy test pregnancy test   staff will notify you if anything is positive on the  You have been given a shot of ceftriaxone 500 mg  Take metronidazole 500 mg--1 tablet 2 times daily for 7 days.  Avoid drinking alcohol within 72 hours of taking this medication        ED Prescriptions     Medication Sig Dispense Auth. Provider   metroNIDAZOLE (FLAGYL) 500 MG tablet Take 1 tablet (500 mg total) by mouth 2 (two) times daily. 14 tablet Demaria Deeney, Janace Aris, MD      PDMP not reviewed this encounter.   Zenia Resides, MD 03/28/23 [phone]

## 2023-03-28 NOTE — ED Triage Notes (Signed)
Patient here today with concerns of exposure to STD. Discharge X 2 months. She has taken meds for gonorrhea and chlamydia but she is still having discharge. She was seen in January for STD symptoms and then again in April at the ED.

## 2023-03-28 NOTE — Discharge Instructions (Addendum)
Pregnancy test pregnancy test   staff will notify you if anything is positive on the  You have been given a shot of ceftriaxone 500 mg  Take metronidazole 500 mg--1 tablet 2 times daily for 7 days.  Avoid drinking alcohol within 72 hours of taking this medication

## 2023-03-29 LAB — CERVICOVAGINAL ANCILLARY ONLY
Bacterial Vaginitis (gardnerella): NEGATIVE
Candida Glabrata: NEGATIVE
Candida Vaginitis: NEGATIVE
Chlamydia: NEGATIVE
Comment: NEGATIVE
Comment: NEGATIVE
Comment: NEGATIVE
Comment: NEGATIVE
Comment: NEGATIVE
Comment: NORMAL
Neisseria Gonorrhea: NEGATIVE
Trichomonas: NEGATIVE

## 2023-03-30 IMAGING — CR DG ANKLE COMPLETE 3+V*R*
3 series · 3 of 3 positions shown · non-contrast
Comparison: None.

CLINICAL DATA: Pain

EXAM:
RIGHT ANKLE - COMPLETE 3+ VIEW

[ankle ap]
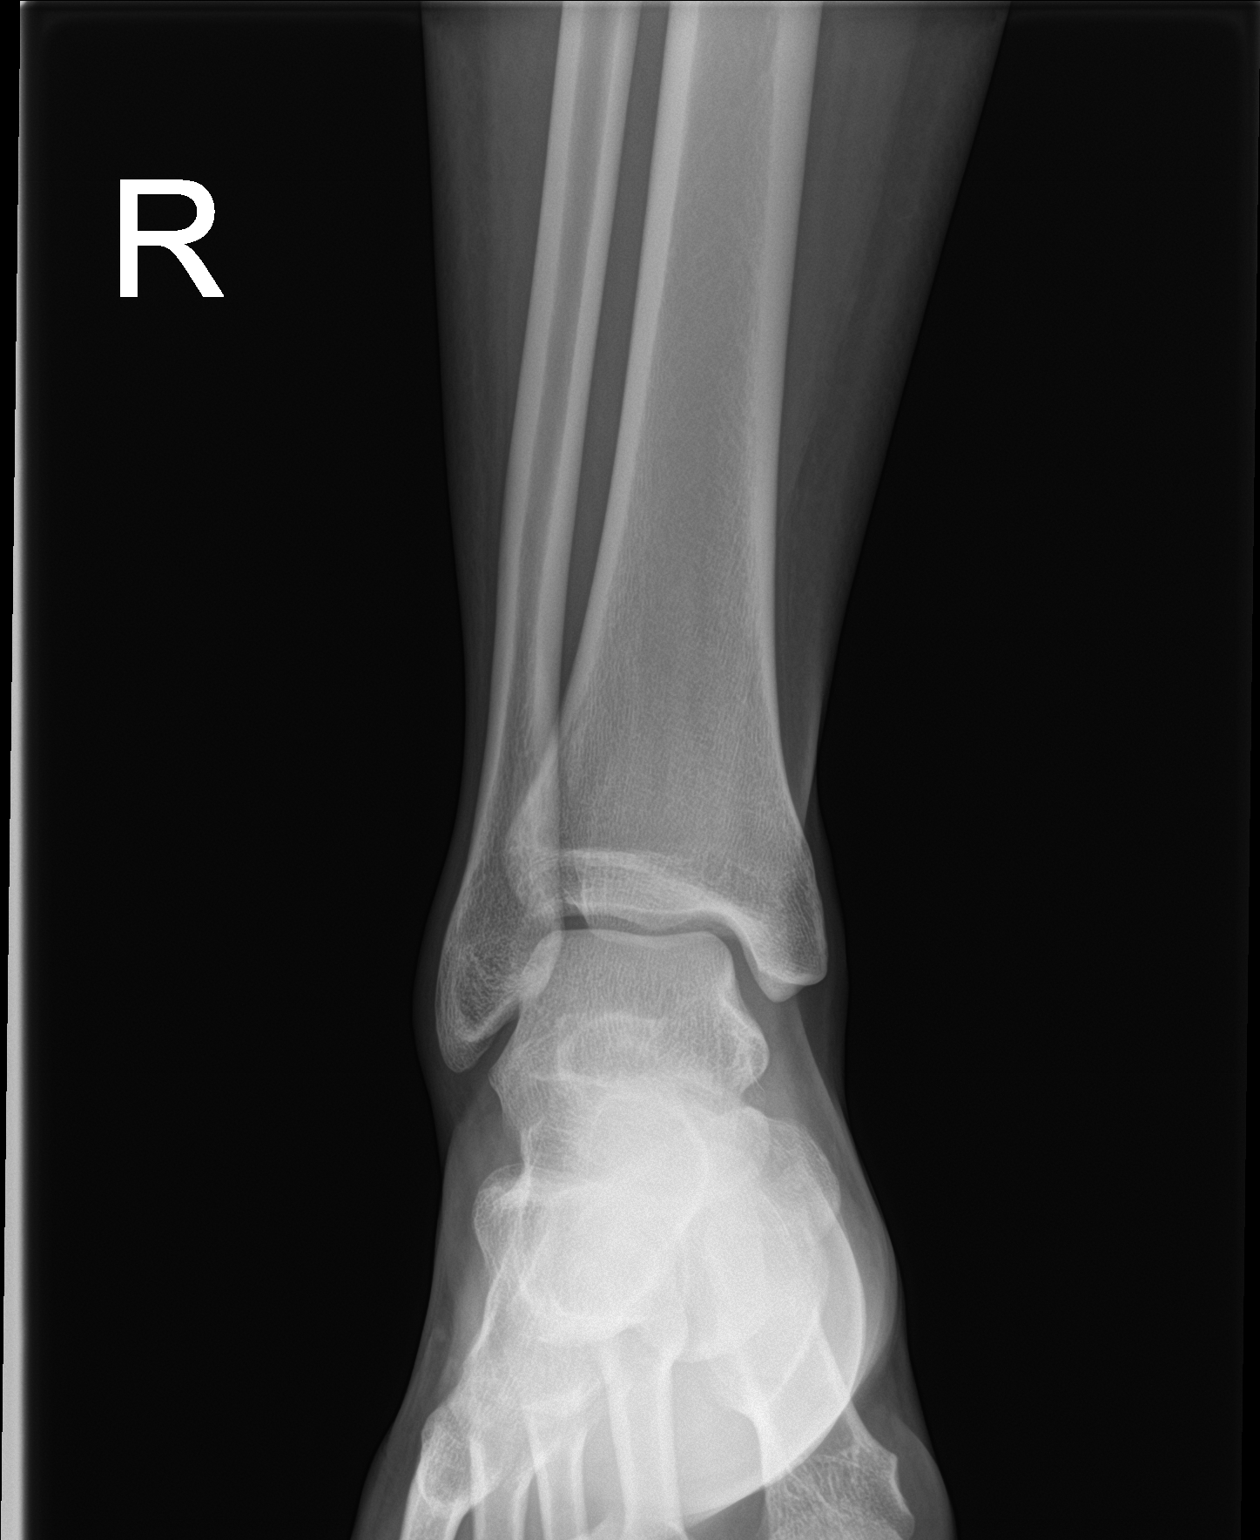

[ankle obl]
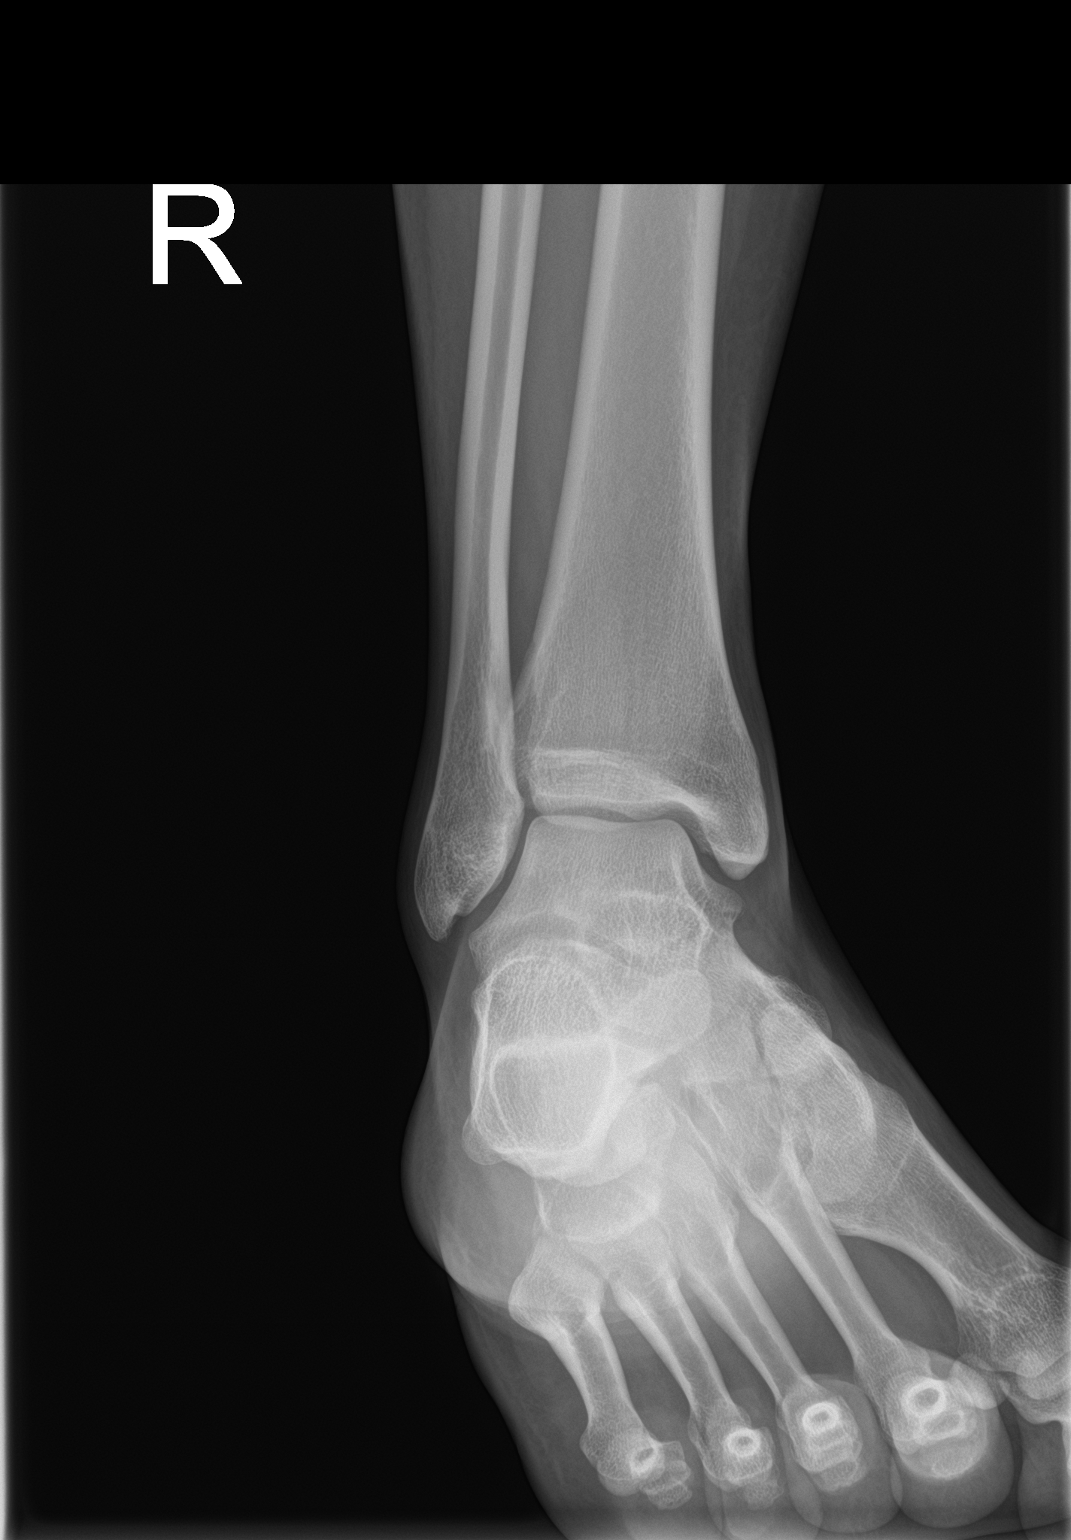

[ankle lat]
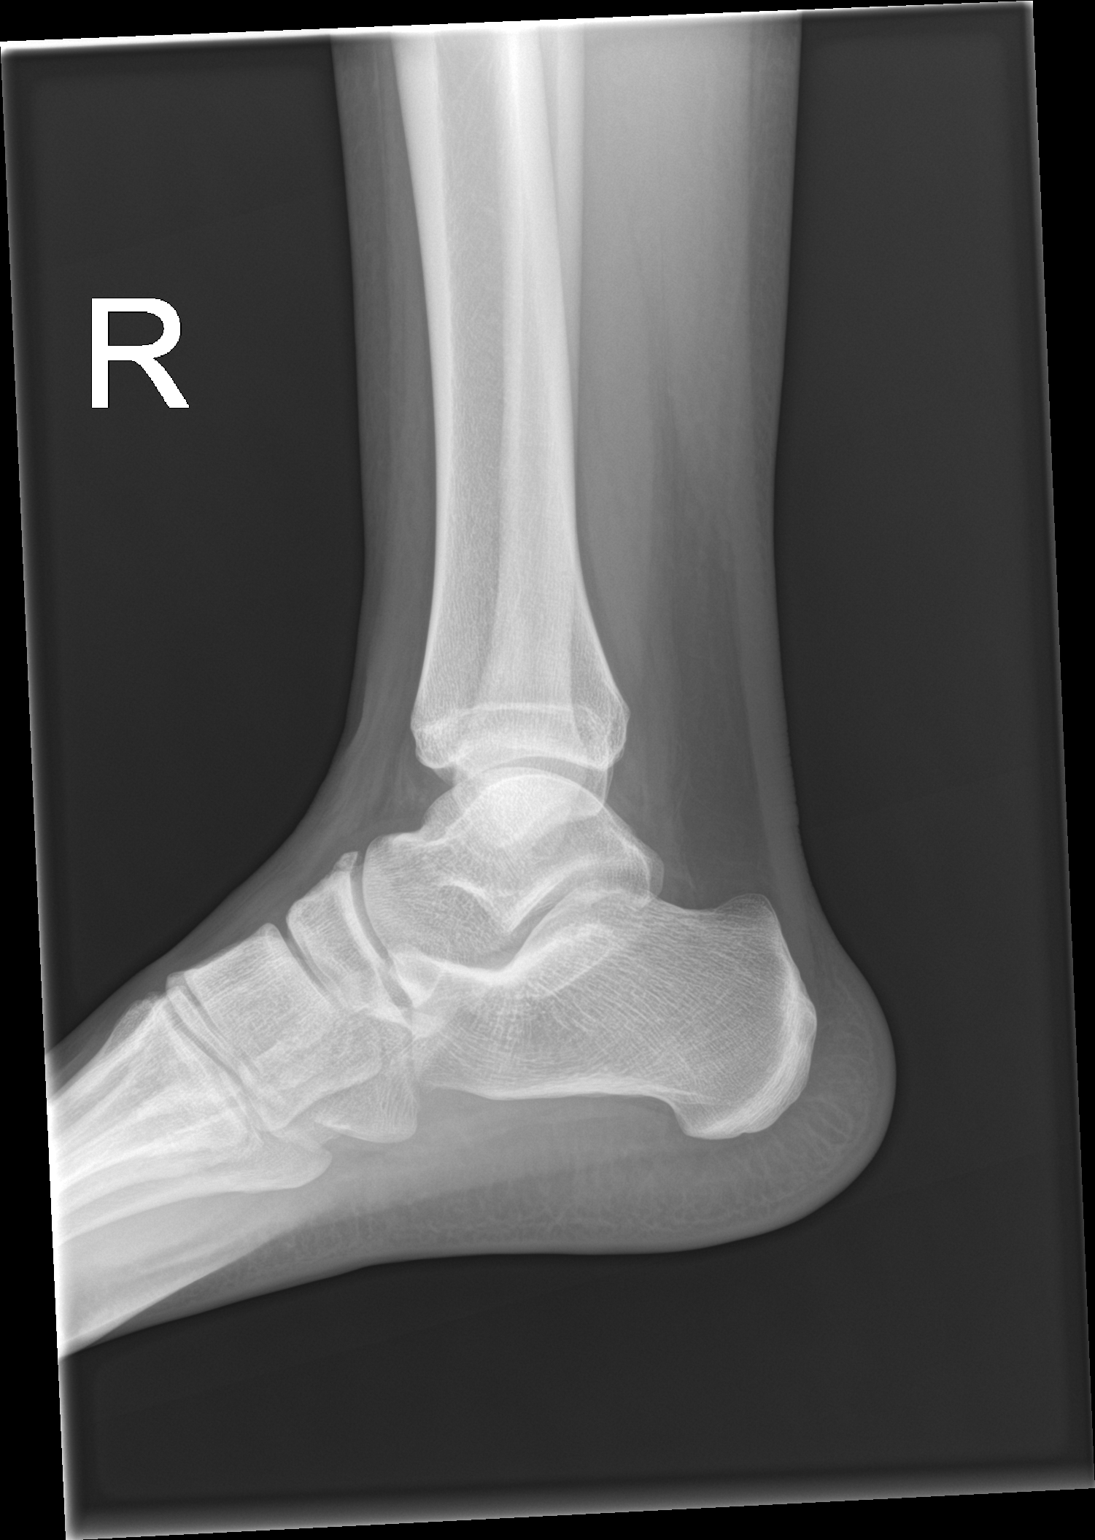

[3 of 3 positions shown; findings below may reference images not displayed]

FINDINGS: Frontal, oblique, and lateral views were obtained. There is no
evident fracture or joint effusion. Joint spaces appear normal.
Small spur along the dorsal proximal navicular. No erosions. Ankle
mortise appears intact.
IMPRESSION: No evident fracture or arthropathy.  Ankle mortise appears intact.

## 2023-04-20 ENCOUNTER — Telehealth: Payer: BLUE CROSS/BLUE SHIELD | Admitting: Physician Assistant

## 2023-04-20 ENCOUNTER — Ambulatory Visit (HOSPITAL_COMMUNITY)
Admission: EM | Admit: 2023-04-20 | Discharge: 2023-04-20 | Disposition: A | Discharge status: Home / Self Care | Payer: BLUE CROSS/BLUE SHIELD | Attending: Family Medicine | Admitting: Family Medicine

## 2023-04-20 ENCOUNTER — Encounter (HOSPITAL_COMMUNITY): Payer: Self-pay | Admitting: *Deleted

## 2023-04-20 DIAGNOSIS — N760 Acute vaginitis: Secondary | ICD-10-CM | POA: Diagnosis present

## 2023-04-20 DIAGNOSIS — Z113 Encounter for screening for infections with a predominantly sexual mode of transmission: Secondary | ICD-10-CM | POA: Diagnosis not present

## 2023-04-20 MED ORDER — METRONIDAZOLE 500 MG PO TABS: 500.0000 mg | ORAL_TABLET | Freq: Two times a day (BID) | ORAL | 0 refills | Status: AC

## 2023-04-20 MED ORDER — FLUCONAZOLE 150 MG PO TABS: 150.0000 mg | ORAL_TABLET | ORAL | 0 refills | Status: AC

## 2023-04-20 NOTE — Progress Notes (Signed)
Because of recurring/persistent symptoms after treatment and negative prior testing, I feel your condition warrants further evaluation and I recommend that you be seen in a face to face visit for reassessment.   NOTE: There will be NO CHARGE for this eVisit   If you are having a true medical emergency please call 911.      For an urgent face to face visit, [place] has eight urgent care centers for your convenience:   NEW!! [facility] Center at [facility] Get Driving Directions [phone] [address] [place], [address]    [facility] Health Urgent Care Center at [facility] Get Driving Directions [phone] [address] [place], [place] [address]   [facility] Health Urgent Care Center [facility]) Get Driving Directions [phone] [address] [place], [place] [address]  [facility] Health Urgent Care Center [facility]) Get Driving Directions [phone] [address] [place],  [place]  [address]  [facility] Health Urgent Care Center [facility] - at [employer]  [phone] [address] [place],  [place] [address]   [facility] Health Urgent Care at [facility] Get Driving Directions [phone] [address] [place], [place] [address]   [facility] Health Urgent Care at [facility] Get Driving Directions  [phone] [address] [place], [place] [address]   [facility] Health Urgent Care at [facility] Directions [phone] [address], [address] [place], [place] [address]  Your MyChart E-visit questionnaire answers were reviewed by a board certified advanced clinical practitioner to complete your personal care plan based on your specific symptoms.  Thank you for using e-Visits.

## 2023-04-20 NOTE — ED Triage Notes (Signed)
Pt states she has been having chunky white discharge x 3 days. She is having some vaginal irritation as well.

## 2023-04-20 NOTE — ED Provider Notes (Signed)
[facility]    CSN: [id number] Arrival date & time: 04/20/23  1547      History   Chief Complaint Chief Complaint  Patient presents with   Vaginal Discharge    I see white milk yellowish discharge I don't know if its a std or bv discharge I wanna be cured for both I don't wanna wait for my results I haven't had sexual contact with anyone since my last check up I'm very worried and would like to be seen - Entered by patient    HPI Kara Neal is a 22 y.o. female.    Vaginal Discharge  Here at 1 white discharge that has been going on for about 3 days.  No abdominal pain and no itching.  No fever.  She has had a little bit of vaginal irritation.  Last menstrual cycle was sometime ago, she does have a Nexplanon.  No allergies to medicines  Past Medical History:  Diagnosis Date   Asthma     There are no problems to display for this patient.   History reviewed. No pertinent surgical history.  OB History   No obstetric history on file.      Home Medications    Prior to Admission medications   Medication Sig Start Date End Date Taking? Authorizing Provider  fluconazole (DIFLUCAN) 150 MG tablet Take 1 tablet (150 mg total) by mouth every 3 (three) days for 3 doses. 04/20/23 04/27/23 Yes Sarah Zerby, Janace Aris, MD  metroNIDAZOLE (FLAGYL) 500 MG tablet Take 1 tablet (500 mg total) by mouth 2 (two) times daily for 7 days. 04/20/23 04/27/23 Yes Zenia Resides, MD  albuterol (PROVENTIL) (2.5 MG/3ML) 0.083% nebulizer solution Take 3 mLs (2.5 mg total) by nebulization every 6 (six) hours as needed for wheezing or shortness of breath. 02/05/22   Raspet, Noberto Retort, PA-C  albuterol (VENTOLIN HFA) 108 (90 Base) MCG/ACT inhaler Inhale 1-2 puffs into the lungs every 6 (six) hours as needed for wheezing or shortness of breath. 03/10/23   Loetta Rough, MD  etonogestrel (NEXPLANON) 68 MG IMPL implant 1 each by Subdermal route once.    [provider]    Family  History Family History  Problem Relation Age of Onset   Asthma Father     Social History Social History   Tobacco Use   Smoking status: Never   Smokeless tobacco: Never  Vaping Use   Vaping Use: Never used  Substance Use Topics   Alcohol use: Yes    Comment: socially   Drug use: Not Currently    Types: Marijuana     Allergies   Patient has no known allergies.   Review of Systems Review of Systems  Genitourinary:  Positive for vaginal discharge.     Physical Exam Triage Vital Signs ED Triage Vitals  Enc Vitals Group     BP 04/20/23 1602 (!) 112/59     Pulse Rate 04/20/23 1602 80     Resp 04/20/23 1602 18     Temp 04/20/23 1602 98.8 F (37.1 C)     Temp Source 04/20/23 1602 Oral     SpO2 04/20/23 1602 96 %     Weight --      Height --      Head Circumference --      Peak Flow --      Pain Score 04/20/23 1600 0     Pain Loc --      Pain Edu? --  Excl. in GC? --    No data found.  Updated Vital Signs BP (!) 112/59 (BP Location: Left Arm)   Pulse 80   Temp 98.8 F (37.1 C) (Oral)   Resp 18   SpO2 96%   Visual Acuity Right Eye Distance:   Left Eye Distance:   Bilateral Distance:    Right Eye Near:   Left Eye Near:    Bilateral Near:     Physical Exam Vitals reviewed.  Constitutional:      General: She is not in acute distress.    Appearance: She is not ill-appearing, toxic-appearing or diaphoretic.  HENT:     Mouth/Throat:     Mouth: Mucous membranes are moist.     Pharynx: No oropharyngeal exudate or posterior oropharyngeal erythema.  Eyes:     Extraocular Movements: Extraocular movements intact.     Conjunctiva/sclera: Conjunctivae normal.     Pupils: Pupils are equal, round, and reactive to light.  Cardiovascular:     Rate and Rhythm: Normal rate and regular rhythm.     Heart sounds: No murmur heard. Pulmonary:     Effort: Pulmonary effort is normal.     Breath sounds: Normal breath sounds.  Musculoskeletal:     Cervical  back: Neck supple.  Lymphadenopathy:     Cervical: No cervical adenopathy.  Skin:    Capillary Refill: Capillary refill takes less than 2 seconds.     Coloration: Skin is not jaundiced or pale.  Neurological:     General: No focal deficit present.     Mental Status: She is alert and oriented to person, place, and time.  Psychiatric:        Behavior: Behavior normal.      UC Treatments / Results  Labs (all labs ordered are listed, but only abnormal results are displayed) Labs Reviewed  CERVICOVAGINAL ANCILLARY ONLY    EKG   Radiology No results found.  Procedures Procedures (including critical care time)  Medications Ordered in UC Medications - No data to display  Initial Impression / Assessment and Plan / UC Course  I have reviewed the triage vital signs and the nursing notes.  Pertinent labs & imaging results that were available during my care of the patient were reviewed by me and considered in my medical decision making (see chart for details).        Vaginal self swab was done.  We decided to treat empirically for BV and possible yeast.  Metronidazole.  Staff will notify her of any positives in treatment protocol Final Clinical Impressions(s) / UC Diagnoses   Final diagnoses:  Acute vaginitis     Discharge Instructions      Take metronidazole 500 mg--1 tablet 2 times daily for 7 days.  Avoid drinking alcohol within 72 hours of taking this medication  Take fluconazole 150 mg--1 tablet every 3 days for 3 doses  Staff will notify you of any positive tests on the swab      ED Prescriptions     Medication Sig Dispense Auth. Provider   metroNIDAZOLE (FLAGYL) 500 MG tablet Take 1 tablet (500 mg total) by mouth 2 (two) times daily for 7 days. 14 tablet Sipriano Fendley, Janace Aris, MD   fluconazole (DIFLUCAN) 150 MG tablet Take 1 tablet (150 mg total) by mouth every 3 (three) days for 3 doses. 3 tablet Anh Mangano, Janace Aris, MD      PDMP not reviewed this  encounter.   Zenia Resides, MD 04/20/23 [phone]

## 2023-04-20 NOTE — Discharge Instructions (Signed)
Take metronidazole 500 mg--1 tablet 2 times daily for 7 days.  Avoid drinking alcohol within 72 hours of taking this medication  Take fluconazole 150 mg--1 tablet every 3 days for 3 doses  Staff will notify you of any positive tests on the swab

## 2023-04-21 LAB — CERVICOVAGINAL ANCILLARY ONLY
Bacterial Vaginitis (gardnerella): NEGATIVE
Candida Glabrata: NEGATIVE
Candida Vaginitis: POSITIVE — AB
Chlamydia: NEGATIVE
Comment: NEGATIVE
Comment: NEGATIVE
Comment: NEGATIVE
Comment: NEGATIVE
Comment: NEGATIVE
Comment: NORMAL
Neisseria Gonorrhea: NEGATIVE
Trichomonas: NEGATIVE

## 2023-06-06 ENCOUNTER — Ambulatory Visit (HOSPITAL_COMMUNITY): Payer: BLUE CROSS/BLUE SHIELD

## 2023-06-07 ENCOUNTER — Ambulatory Visit (HOSPITAL_COMMUNITY): Payer: BLUE CROSS/BLUE SHIELD

## 2023-06-13 ENCOUNTER — Ambulatory Visit (HOSPITAL_COMMUNITY): Payer: BLUE CROSS/BLUE SHIELD

## 2023-06-14 ENCOUNTER — Encounter (HOSPITAL_COMMUNITY): Payer: Self-pay

## 2023-06-14 ENCOUNTER — Ambulatory Visit (HOSPITAL_COMMUNITY)
Admission: RE | Admit: 2023-06-14 | Discharge: 2023-06-14 | Disposition: A | Discharge status: Home / Self Care | Payer: BLUE CROSS/BLUE SHIELD | Source: Ambulatory Visit | Attending: Emergency Medicine | Admitting: Emergency Medicine

## 2023-06-14 VITALS — BP 134/76 | HR 80 | Temp 98.4°F | Resp 16 | Ht 67.0 in | Wt 147.0 lb

## 2023-06-14 DIAGNOSIS — Z113 Encounter for screening for infections with a predominantly sexual mode of transmission: Secondary | ICD-10-CM | POA: Diagnosis not present

## 2023-06-14 DIAGNOSIS — N898 Other specified noninflammatory disorders of vagina: Secondary | ICD-10-CM | POA: Insufficient documentation

## 2023-06-14 LAB — POCT URINE PREGNANCY: Preg Test, Ur: NEGATIVE

## 2023-06-14 NOTE — Discharge Instructions (Addendum)
We will call you if anything on your swab returns positive. You can also see these results on MyChart. Please abstain from sexual intercourse until your results return.  Please call OB/GYN to make appointment

## 2023-06-14 NOTE — ED Provider Notes (Signed)
[facility]    CSN: [id number] Arrival date & time: 06/14/23  1615     History   Chief Complaint Chief Complaint  Patient presents with   Vaginal Discharge    I had unprotected sex with  a guy and his girlfriend and wanted to get checked been having itching symptoms - Entered by patient    HPI Kara Neal is a 22 y.o. female.  Here for STD testing Reports unprotected intercourse with new partners. Denies known exposure to STD. 4 day history of vaginal itching. No discharge, odor, rash, dysuria.  LMP 5/22, has implant   Past Medical History:  Diagnosis Date   Asthma     There are no problems to display for this patient.   History reviewed. No pertinent surgical history.  OB History   No obstetric history on file.      Home Medications    Prior to Admission medications   Medication Sig Start Date End Date Taking? Authorizing Provider  albuterol (PROVENTIL) (2.5 MG/3ML) 0.083% nebulizer solution Take 3 mLs (2.5 mg total) by nebulization every 6 (six) hours as needed for wheezing or shortness of breath. 02/05/22  Yes Raspet, Erin K, PA-C  albuterol (VENTOLIN HFA) 108 (90 Base) MCG/ACT inhaler Inhale 1-2 puffs into the lungs every 6 (six) hours as needed for wheezing or shortness of breath. 03/10/23  Yes Loetta Rough, MD  etonogestrel (NEXPLANON) 68 MG IMPL implant 1 each by Subdermal route once.    [provider]    Family History Family History  Problem Relation Age of Onset   Asthma Father     Social History Social History   Tobacco Use   Smoking status: Never   Smokeless tobacco: Never  Vaping Use   Vaping status: Never Used  Substance Use Topics   Alcohol use: Yes    Comment: socially   Drug use: Not Currently    Types: Marijuana     Allergies   Patient has no known allergies.   Review of Systems Review of Systems Per HPI  Physical Exam Triage Vital Signs ED Triage Vitals  Encounter Vitals Group     BP  06/14/23 1653 134/76     Systolic BP Percentile --      Diastolic BP Percentile --      Pulse Rate 06/14/23 1653 80     Resp 06/14/23 1653 16     Temp 06/14/23 1653 98.4 F (36.9 C)     Temp Source 06/14/23 1653 Oral     SpO2 06/14/23 1653 98 %     Weight 06/14/23 1653 147 lb (66.7 kg)     Height 06/14/23 1653 5\' 7"  (1.702 m)     Head Circumference --      Peak Flow --      Pain Score 06/14/23 1652 0     Pain Loc --      Pain Education --      Exclude from Growth Chart --    No data found.  Updated Vital Signs BP 134/76 (BP Location: Right Arm)   Pulse 80   Temp 98.4 F (36.9 C) (Oral)   Resp 16   Ht 5\' 7"  (1.702 m)   Wt 147 lb (66.7 kg)   LMP 04/14/2023 (Approximate)   SpO2 98%   BMI 23.02 kg/m    Physical Exam Vitals and nursing note reviewed.  Constitutional:      General: She is not in acute distress.    Appearance:  Normal appearance.  HENT:     Mouth/Throat:     Pharynx: Oropharynx is clear.  Cardiovascular:     Rate and Rhythm: Normal rate and regular rhythm.     Heart sounds: Normal heart sounds.  Pulmonary:     Effort: Pulmonary effort is normal.     Breath sounds: Normal breath sounds.  Abdominal:     Palpations: Abdomen is soft.  Neurological:     Mental Status: She is alert and oriented to person, place, and time.     UC Treatments / Results  Labs (all labs ordered are listed, but only abnormal results are displayed) Labs Reviewed  POCT URINE PREGNANCY  CERVICOVAGINAL ANCILLARY ONLY    EKG  Radiology No results found.  Procedures Procedures (including critical care time)  Medications Ordered in UC Medications - No data to display  Initial Impression / Assessment and Plan / UC Course  I have reviewed the triage vital signs and the nursing notes.  Pertinent labs & imaging results that were available during my care of the patient were reviewed by me and considered in my medical decision making (see chart for details).  UPT  negative  Cytology swab pending. Treat positive result as indicated. Safe sex precautions. All questions answered, patient agreeable to plan Provided with ob/gyn clinics for follow up and routine health  Final Clinical Impressions(s) / UC Diagnoses   Final diagnoses:  Screen for STD (sexually transmitted disease)  Vaginal itching     Discharge Instructions      We will call you if anything on your swab returns positive. You can also see these results on MyChart. Please abstain from sexual intercourse until your results return.  Please call OB/GYN to make appointment     ED Prescriptions   None    PDMP not reviewed this encounter.   Keontay Vora, Ray Church 06/14/23 1820

## 2023-06-14 NOTE — ED Triage Notes (Signed)
Patient here today with c/o vaginal discharge and itching X 4 days.

## 2023-06-15 LAB — CERVICOVAGINAL ANCILLARY ONLY
Bacterial Vaginitis (gardnerella): NEGATIVE
Candida Glabrata: NEGATIVE
Candida Vaginitis: NEGATIVE
Chlamydia: NEGATIVE
Comment: NEGATIVE
Comment: NEGATIVE
Comment: NEGATIVE
Comment: NEGATIVE
Comment: NEGATIVE
Comment: NORMAL
Neisseria Gonorrhea: NEGATIVE
Trichomonas: NEGATIVE

## 2023-08-04 ENCOUNTER — Telehealth: Payer: BLUE CROSS/BLUE SHIELD | Admitting: Physician Assistant

## 2023-08-04 DIAGNOSIS — Z3200 Encounter for pregnancy test, result unknown: Secondary | ICD-10-CM

## 2023-08-04 NOTE — Progress Notes (Signed)
Because of dizziness and need to assess for pregnancy, I feel your condition warrants further evaluation and I recommend that you be seen in a face to face visit.   NOTE: There will be NO CHARGE for this eVisit   If you are having a true medical emergency please call 911.      For an urgent face to face visit, [facility] has eight urgent care centers for your convenience:   NEW!! [facility] Center at [facility] Get Driving Directions [phone] [address], [address] [place], [address]    [facility] Health Urgent Care Center at [facility] Get Driving Directions [phone] [address] [place], [place] [address]   [facility] Health Urgent Care Center [facility]) Get Driving Directions [phone] [address] [place], [place] [address]  [facility] Urgent Care Center [facility]) Get Driving Directions [phone] [address] [place],  [place]  [address]  [facility] Health Urgent Care Center [facility] - at [employer]  [phone] [address] [place],  [place] [address]   [facility] Health Urgent Care at [facility] Get Driving Directions [phone] [address], [address] [place], [place] [address]   [facility] Health Urgent Care at [facility] Get Driving Directions  [phone] [address] [place], [place] [address]   [facility] Health Urgent Care at [facility] Directions [phone] [address] [place], [place] [address]  Your MyChart E-visit questionnaire answers were reviewed by a board certified advanced clinical practitioner to complete your personal care plan based on your specific symptoms.  Thank you for using e-Visits.

## 2023-08-11 ENCOUNTER — Other Ambulatory Visit: Payer: Self-pay | Admitting: Medical

## 2023-08-11 ENCOUNTER — Inpatient Hospital Stay (HOSPITAL_COMMUNITY)
Admission: AD | Admit: 2023-08-11 | Discharge: 2023-08-11 | Disposition: A | Discharge status: Home / Self Care | Payer: BLUE CROSS/BLUE SHIELD | Attending: Obstetrics and Gynecology | Admitting: Obstetrics and Gynecology

## 2023-08-11 DIAGNOSIS — N926 Irregular menstruation, unspecified: Secondary | ICD-10-CM | POA: Insufficient documentation

## 2023-08-11 DIAGNOSIS — Z3202 Encounter for pregnancy test, result negative: Secondary | ICD-10-CM | POA: Diagnosis not present

## 2023-08-11 DIAGNOSIS — Z7689 Persons encountering health services in other specified circumstances: Secondary | ICD-10-CM

## 2023-08-11 LAB — HCG, QUANTITATIVE, PREGNANCY: hCG, Beta Chain, Quant, S: <1 m[IU]/mL (ref <5)

## 2023-08-11 LAB — POCT PREGNANCY, URINE: Preg Test, Ur: NEGATIVE

## 2023-08-11 MED ORDER — ACETAMINOPHEN 500 MG PO TABS
1000.0000 mg | ORAL_TABLET | Freq: Once | ORAL | Status: AC
  Administered 2023-08-11: 1000 mg via ORAL
  Filled 2023-08-11: qty 2

## 2023-08-11 NOTE — MAU Provider Note (Signed)
  History     CSN: [id number]  Arrival date and time: 08/11/23 1133   Event Date/Time   First Provider Initiated Contact with Patient 08/11/23 1353       Chief Complaint  Patient presents with   Abdominal Pain   Vaginal Bleeding   HPI Ms. Kara Neal is a 22 y.o. G0 at [facility] who presents to MAU today with complaint of vaginal bleeding and cramping that started today while at work. The patient states +HPT 2 weeks ago. She has an expired Nexplanon in place and has irregular periods. LMP ~ 3 months ago. Patient also endorses fatigue and pain at the site of the Nexplanon.    OB History   No obstetric history on file.     Past Medical History:  Diagnosis Date   Asthma     No past surgical history on file.  Family History  Problem Relation Age of Onset   Asthma Father     Social History   Tobacco Use   Smoking status: Never   Smokeless tobacco: Never  Vaping Use   Vaping status: Never Used  Substance Use Topics   Alcohol use: Yes    Comment: socially   Drug use: Not Currently    Types: Marijuana    Allergies:  Allergies  Allergen Reactions   Bee Pollen    Cat Hair Extract     No medications prior to admission.    Review of Systems  Constitutional:  Positive for fatigue. Negative for fever.  Gastrointestinal:  Positive for abdominal pain. Negative for nausea.  Genitourinary:  Positive for vaginal bleeding. Negative for vaginal discharge.   Physical Exam   Blood pressure 114/67, pulse 78, temperature 98.2 F (36.8 C), temperature source Oral, resp. rate 18, SpO2 100%.  Physical Exam Constitutional:      Appearance: She is normal weight. She is not ill-appearing.  Cardiovascular:     Rate and Rhythm: Normal rate.  Pulmonary:     Effort: Pulmonary effort is normal.  Abdominal:     General: Abdomen is flat. There is no distension.  Skin:    General: Skin is warm and dry.     Findings: No erythema.  Neurological:     Mental Status: She is  alert and oriented to person, place, and time.  Psychiatric:        Mood and Affect: Mood normal.     Results for orders placed or performed during the hospital encounter of 08/11/23 (from the past 24 hour(s))  Pregnancy, urine POC     Status: None   Collection Time: 08/11/23 11:53 AM  Result Value Ref Range   Preg Test, Ur NEGATIVE NEGATIVE  hCG, quantitative, pregnancy     Status: None   Collection Time: 08/11/23 12:11 PM  Result Value Ref Range   hCG, Beta Chain, Quant, S <1 <5 mIU/mL     MAU Course  Procedures  MDM UPT negative, hCG ordered due to report of +HPT recently, hCG negative as noted above  Assessment and Plan  A:  Irregular menses  Negative pregnancy test   P:  Discharge home Bleeding precautions discussed Patient advised to follow-up with [facility] for Nexplanon removal, in-basket sent to admin pool. Patient interested in re-insertion as well. Advised abstinence or condom use until GYN appt.  Patient may return to MAU as needed or if her condition were to change or worsen   Vonzella Nipple, PA-C 08/11/2023, 2:39 PM

## 2023-08-11 NOTE — MAU Note (Signed)
Called Main Lab regarding patient's blood work that is not in process. Lab to run the sample now.

## 2023-08-11 NOTE — MAU Note (Signed)
Pt arrived by EMS.  Report now: LMP 3 months ago.  +HPT 2 wks.  Started cramping and having bleeding at work today.   VSS.  Reports as G1, no care yet.   In chart review.  Pt seen 7/22.  Neg preg test then.  No LMP listed, "implant".

## 2023-08-11 NOTE — MAU Note (Signed)
.  Kara Neal is a 22 y.o. at [facility] here in MAU reporting: Intermittent lower back pain, lower abdominal pain, and vaginal bleeding that began this morning. She reports she took a pregnancy test at home two weeks ago and it was positive. Reports ongoing fatigue and frequent urination. Reports an increased appetite. Denies recent IC.   LMP: Unsure. Reports it was three months ago. Reports she has an implant in her arm that expired years ago.   Onset of complaint: This morning Pain score: 10/10 lower abdomen and 9/10 lower back  FHT: n/a Lab orders placed from triage: POCT Preg, hcg

## 2023-08-12 ENCOUNTER — Ambulatory Visit (HOSPITAL_COMMUNITY): Payer: BLUE CROSS/BLUE SHIELD

## 2023-09-15 ENCOUNTER — Ambulatory Visit (HOSPITAL_COMMUNITY)
Admission: RE | Admit: 2023-09-15 | Discharge: 2023-09-15 | Disposition: A | Discharge status: Home / Self Care | Payer: BLUE CROSS/BLUE SHIELD | Source: Ambulatory Visit | Attending: Physician Assistant | Admitting: Physician Assistant

## 2023-09-15 ENCOUNTER — Other Ambulatory Visit: Payer: Self-pay

## 2023-09-15 ENCOUNTER — Encounter (HOSPITAL_COMMUNITY): Payer: Self-pay

## 2023-09-15 VITALS — BP 113/74 | HR 77 | Temp 98.3°F | Resp 20

## 2023-09-15 DIAGNOSIS — N898 Other specified noninflammatory disorders of vagina: Secondary | ICD-10-CM | POA: Diagnosis present

## 2023-09-15 DIAGNOSIS — Z113 Encounter for screening for infections with a predominantly sexual mode of transmission: Secondary | ICD-10-CM | POA: Diagnosis not present

## 2023-09-15 DIAGNOSIS — G44209 Tension-type headache, unspecified, not intractable: Secondary | ICD-10-CM | POA: Insufficient documentation

## 2023-09-15 DIAGNOSIS — R229 Localized swelling, mass and lump, unspecified: Secondary | ICD-10-CM | POA: Diagnosis present

## 2023-09-15 MED ORDER — BACLOFEN 10 MG PO TABS
10.0000 mg | ORAL_TABLET | Freq: Two times a day (BID) | ORAL | 0 refills | Status: DC | PRN
Start: 2023-09-15 — End: 2023-09-29

## 2023-09-15 MED ORDER — KETOROLAC TROMETHAMINE 30 MG/ML IJ SOLN
30.0000 mg | Freq: Once | INTRAMUSCULAR | Status: AC
  Administered 2023-09-15: 30 mg via INTRAMUSCULAR

## 2023-09-15 MED ORDER — DEXAMETHASONE SODIUM PHOSPHATE 10 MG/ML IJ SOLN
10.0000 mg | Freq: Once | INTRAMUSCULAR | Status: AC
  Administered 2023-09-15: 10 mg via INTRAMUSCULAR

## 2023-09-15 MED ORDER — KETOROLAC TROMETHAMINE 30 MG/ML IJ SOLN
INTRAMUSCULAR | Status: AC
  Filled 2023-09-15: qty 1

## 2023-09-15 MED ORDER — DEXAMETHASONE SODIUM PHOSPHATE 10 MG/ML IJ SOLN
INTRAMUSCULAR | Status: AC
  Filled 2023-09-15: qty 1

## 2023-09-15 NOTE — ED Provider Notes (Signed)
[facility]    CSN: [id number] Arrival date & time: 09/15/23  1648      History   Chief Complaint Chief Complaint  Patient presents with   Appointment    5:00   Headache    HPI Kara Neal is a 22 y.o. female.   Patient presents today with several concerns.  Her primary concern today is a 2 to 3-day history of significant headache.  She reports that her pain is rated 7/8 on a 0-10 pain scale, localized to her frontal region, described as achiness and heaviness, no alleviating factors identified.  She does report associated photosensitivity but denies any nausea, vomiting, visual disturbance, focal weakness, dysarthria.  She does have a nodule in this area and initially thought she was developing a large pimple but reports that the headache has spread and is different.  She has had headaches in the past but denies formal diagnosis of primary headache disorder.  Denies family history of migraines.  She denies any recent illness including cough, congestion.  She did blow her nose earlier today and had some blood in her congestion but this is since resolved.  She has tried Tylenol and ibuprofen without improvement.  She has never seen a neurologist.  She is calm for that she is not pregnant.  She denies any recent head injury or medication changes.  This is not the worst headache of her life.  She is requesting STI testing.  She reports ongoing vaginal discharge.  She has been using boric acid over-the-counter due to history of recurrent bacterial vaginosis but continues to have malodorous discharge.  She is no specific concern for STI she has not been sexually active in the past few months but is requesting a complete STI test.  Denies any changes to personal hygiene products including soaps or detergents.  Denies any recent antibiotics.  She was last tested 06/14/2023 and it was negative.    Past Medical History:  Diagnosis Date   Asthma     There are no problems to  display for this patient.   History reviewed. No pertinent surgical history.  OB History   No obstetric history on file.      Home Medications    Prior to Admission medications   Medication Sig Start Date End Date Taking? Authorizing Provider  baclofen (LIORESAL) 10 MG tablet Take 1 tablet (10 mg total) by mouth 2 (two) times daily as needed for muscle spasms. 09/15/23  Yes Trixy Loyola K, PA-C  albuterol (PROVENTIL) (2.5 MG/3ML) 0.083% nebulizer solution Take 3 mLs (2.5 mg total) by nebulization every 6 (six) hours as needed for wheezing or shortness of breath. 02/05/22   Thong Feeny, Noberto Retort, PA-C  albuterol (VENTOLIN HFA) 108 (90 Base) MCG/ACT inhaler Inhale 1-2 puffs into the lungs every 6 (six) hours as needed for wheezing or shortness of breath. 03/10/23   Loetta Rough, MD  etonogestrel (NEXPLANON) 68 MG IMPL implant 1 each by Subdermal route once.    [provider]    Family History Family History  Problem Relation Age of Onset   Asthma Father     Social History Social History   Tobacco Use   Smoking status: Never   Smokeless tobacco: Never  Vaping Use   Vaping status: Never Used  Substance Use Topics   Alcohol use: Yes    Comment: socially   Drug use: Not Currently    Types: Marijuana     Allergies   Bee pollen and Cat  hair extract   Review of Systems Review of Systems  Constitutional:  Positive for activity change. Negative for appetite change, fatigue and fever.  HENT:  Positive for nosebleeds. Negative for congestion, sinus pressure, sneezing and sore throat.   Eyes:  Positive for photophobia. Negative for visual disturbance.  Respiratory:  Negative for shortness of breath.   Cardiovascular:  Negative for chest pain.  Gastrointestinal:  Negative for abdominal pain, diarrhea, nausea and vomiting.  Genitourinary:  Positive for vaginal discharge. Negative for dysuria, frequency, urgency, vaginal bleeding and vaginal pain.  Neurological:  Positive  for headaches. Negative for dizziness, seizures, syncope, speech difficulty, weakness, light-headedness and numbness.     Physical Exam Triage Vital Signs ED Triage Vitals  Encounter Vitals Group     BP 09/15/23 1708 113/74     Systolic BP Percentile --      Diastolic BP Percentile --      Pulse Rate 09/15/23 1708 77     Resp 09/15/23 1708 20     Temp 09/15/23 1708 98.3 F (36.8 C)     Temp Source 09/15/23 1708 Oral     SpO2 09/15/23 1708 97 %     Weight --      Height --      Head Circumference --      Peak Flow --      Pain Score 09/15/23 1705 8     Pain Loc --      Pain Education --      Exclude from Growth Chart --    No data found.  Updated Vital Signs BP 113/74 (BP Location: Right Arm)   Pulse 77   Temp 98.3 F (36.8 C) (Oral)   Resp 20   LMP 09/11/2023   SpO2 97%   Visual Acuity Right Eye Distance:   Left Eye Distance:   Bilateral Distance:    Right Eye Near:   Left Eye Near:    Bilateral Near:     Physical Exam Vitals reviewed.  Constitutional:      General: She is awake. She is not in acute distress.    Appearance: Normal appearance. She is well-developed. She is not ill-appearing.     Comments: Very pleasant female appears stated age in no acute distress sitting comfortably in exam room  HENT:     Head: Normocephalic and atraumatic. No raccoon eyes, Battle's sign or contusion.     Right Ear: External ear normal. There is impacted cerumen.     Left Ear: External ear normal. There is impacted cerumen.     Ears:     Comments: Cerumen impaction noted bilaterally.    Nose: Nose normal.     Mouth/Throat:     Tongue: Tongue does not deviate from midline.     Pharynx: Uvula midline. No oropharyngeal exudate or posterior oropharyngeal erythema.  Eyes:     Extraocular Movements: Extraocular movements intact.     Pupils: Pupils are equal, round, and reactive to light.  Cardiovascular:     Rate and Rhythm: Normal rate and regular rhythm.     Heart  sounds: Normal heart sounds, S1 normal and S2 normal. No murmur heard. Pulmonary:     Effort: Pulmonary effort is normal.     Breath sounds: Normal breath sounds. No wheezing, rhonchi or rales.     Comments: Clear to auscultation bilaterally Musculoskeletal:     Cervical back: No spinous process tenderness or muscular tenderness.     Comments: Strength 5/5 bilateral upper and  lower extremities.  Lymphadenopathy:     Head:     Right side of head: No submental, submandibular or tonsillar adenopathy.     Left side of head: No submental, submandibular or tonsillar adenopathy.  Skin:    Comments: 1 cm mobile but tender nodule in glabella without purulence, drainage, fluctuance.  Neurological:     General: No focal deficit present.     Mental Status: She is alert and oriented to person, place, and time.     Cranial Nerves: Cranial nerves 2-12 are intact.     Motor: Motor function is intact.     Coordination: Coordination is intact.     Gait: Gait is intact.     Comments: Cranial nerves II through XII grossly intact  Psychiatric:        Behavior: Behavior is cooperative.      UC Treatments / Results  Labs (all labs ordered are listed, but only abnormal results are displayed) Labs Reviewed  CERVICOVAGINAL ANCILLARY ONLY    EKG   Radiology No results found.  Procedures Procedures (including critical care time)  Medications Ordered in UC Medications  ketorolac (TORADOL) 30 MG/ML injection 30 mg (30 mg Intramuscular Given 09/15/23 1735)  dexamethasone (DECADRON) injection 10 mg (10 mg Intramuscular Given 09/15/23 1735)    Initial Impression / Assessment and Plan / UC Course  I have reviewed the triage vital signs and the nursing notes.  Pertinent labs & imaging results that were available during my care of the patient were reviewed by me and considered in my medical decision making (see chart for details).     Patient is well-appearing, afebrile, nontoxic,  nontachycardic.  Given ongoing vaginal discharge STI swab was collected.  Will contact her if anything is positive to arrange treatment.  She was encouraged to use hypoallergenic soaps and detergents and loosefitting cotton underwear.  If she develops any pelvic pain, abdominal pain, fever, nausea, vomiting she needs to return for reevaluation.  Patient is well-appearing, afebrile, nontoxic, nontachycardic.  Vital signs of physical exam are reassuring with no indication for emergent evaluation or imaging.  She was given ketorolac and Decadron in clinic with improvement of symptoms and her pain improved to a 5 on a 0-10 pain scale.  I suspect symptoms are related to a tension headache.  She was instructed not to take NSAIDs for the next 24 hours but can use acetaminophen/Tylenol as needed.  She was given baclofen for pain relief with instruction not to drive or drink alcohol taking this.  I do not think that the nodule on her forehead is related to the headache but she was encouraged to use warm compresses and return if it changes in any way.  She is to rest and drink plenty of fluid.  Discussed that if her symptoms do not improve within a few days or if anything worsens and she has worsening headache, the worst headache of her life, fever, visual disturbance, nausea, vomiting she needs to be seen immediately.  Strict return precautions given.  Work excuse note provided.  Final Clinical Impressions(s) / UC Diagnoses   Final diagnoses:  Tension headache  Skin nodule  Vaginal discharge  Screening examination for STI     Discharge Instructions      I believe that your symptoms are related to a tension headache.  We gave you an injection of ketorolac so do not take NSAIDs for the next 24 hours.  NSAIDs includes aspirin, ibuprofen/Advil, naproxen/Aleve.  You can use acetaminophen/Tylenol for  additional pain relief.  Take baclofen up to twice a day.  This will make you sleepy so do not drive or drink  alcohol taking it.  Make sure you rest and drink plenty of fluids.  If your symptoms are not improving within a day or if anything worsens please return for reevaluation.  As we discussed, use a warm compress on the area on your forehead.  If this gets larger or changes in any way please return for reevaluation.  We will contact you if any of your testing is positive you will need to arrange treatment.  Wear loosefitting cotton underwear and use hypoallergenic soaps and detergents.  If anything changes return for reevaluation.     ED Prescriptions     Medication Sig Dispense Auth. Provider   baclofen (LIORESAL) 10 MG tablet Take 1 tablet (10 mg total) by mouth 2 (two) times daily as needed for muscle spasms. 14 each Aimar Shrewsbury, Noberto Retort, PA-C      PDMP not reviewed this encounter.   Jeani Hawking, PA-C 09/15/23 1818

## 2023-09-15 NOTE — ED Notes (Signed)
Prior to leaving treatment room, patient asked about a cervical swab,  patient believes something is not right and pH is off

## 2023-09-15 NOTE — Discharge Instructions (Signed)
I believe that your symptoms are related to a tension headache.  We gave you an injection of ketorolac so do not take NSAIDs for the next 24 hours.  NSAIDs includes aspirin, ibuprofen/Advil, naproxen/Aleve.  You can use acetaminophen/Tylenol for additional pain relief.  Take baclofen up to twice a day.  This will make you sleepy so do not drive or drink alcohol taking it.  Make sure you rest and drink plenty of fluids.  If your symptoms are not improving within a day or if anything worsens please return for reevaluation.  As we discussed, use a warm compress on the area on your forehead.  If this gets larger or changes in any way please return for reevaluation.  We will contact you if any of your testing is positive you will need to arrange treatment.  Wear loosefitting cotton underwear and use hypoallergenic soaps and detergents.  If anything changes return for reevaluation.

## 2023-09-15 NOTE — ED Triage Notes (Addendum)
Complains of symptoms for 2 days.  Complains of headache. Room spinning, and overall heaviness.  Patient complains of migraines, but has not been evaluated by a provider for this diagnosis.  Both eyes burning, had a bloody nose yesterday and today l.  Patient has had tylenol without relief.  Patient reports he head hurts in center forehead.

## 2023-09-17 ENCOUNTER — Telehealth (HOSPITAL_COMMUNITY): Payer: Self-pay | Admitting: Emergency Medicine

## 2023-09-17 MED ORDER — METRONIDAZOLE 500 MG PO TABS: 500.0000 mg | ORAL_TABLET | Freq: Two times a day (BID) | ORAL | 0 refills | Status: DC

## 2023-09-17 NOTE — Telephone Encounter (Signed)
Metronazole for positive BV

## 2023-09-20 LAB — CERVICOVAGINAL ANCILLARY ONLY
Bacterial Vaginitis (gardnerella): POSITIVE — AB
Candida Glabrata: NEGATIVE
Candida Vaginitis: NEGATIVE
Chlamydia: NEGATIVE
Comment: NEGATIVE
Comment: NEGATIVE
Comment: NEGATIVE
Comment: NEGATIVE
Comment: NEGATIVE
Comment: NORMAL
Neisseria Gonorrhea: NEGATIVE
Trichomonas: NEGATIVE

## 2023-09-21 ENCOUNTER — Encounter: Payer: BLUE CROSS/BLUE SHIELD | Admitting: Family Medicine

## 2023-09-21 ENCOUNTER — Other Ambulatory Visit: Payer: Self-pay

## 2023-09-29 ENCOUNTER — Telehealth: Payer: BLUE CROSS/BLUE SHIELD | Admitting: Family

## 2023-09-29 DIAGNOSIS — N760 Acute vaginitis: Secondary | ICD-10-CM | POA: Diagnosis not present

## 2023-09-29 DIAGNOSIS — Z09 Encounter for follow-up examination after completed treatment for conditions other than malignant neoplasm: Secondary | ICD-10-CM | POA: Diagnosis not present

## 2023-09-29 DIAGNOSIS — B9689 Other specified bacterial agents as the cause of diseases classified elsewhere: Secondary | ICD-10-CM | POA: Diagnosis not present

## 2023-09-29 MED ORDER — METRONIDAZOLE 0.75 % VA GEL: 1.0000 | Freq: Every day | VAGINAL | 0 refills | Status: AC

## 2023-09-29 NOTE — Progress Notes (Signed)
Virtual Visit Consent   Kara Neal, you are scheduled for a virtual visit with a [facility] provider today. Just as with appointments in the office, your consent must be obtained to participate. Your consent will be active for this visit and any virtual visit you may have with one of our providers in the next 365 days. If you have a MyChart account, a copy of this consent can be sent to you electronically.  As this is a virtual visit, video technology does not allow for your provider to perform a traditional examination. This may limit your provider's ability to fully assess your condition. If your provider identifies any concerns that need to be evaluated in person or the need to arrange testing (such as labs, EKG, etc.), we will make arrangements to do so. Although advances in technology are sophisticated, we cannot ensure that it will always work on either your end or our end. If the connection with a video visit is poor, the visit may have to be switched to a telephone visit. With either a video or telephone visit, we are not always able to ensure that we have a secure connection.  By engaging in this virtual visit, you consent to the provision of healthcare and authorize for your insurance to be billed (if applicable) for the services provided during this visit. Depending on your insurance coverage, you may receive a charge related to this service.  I need to obtain your verbal consent now. Are you willing to proceed with your visit today? Kara Neal has provided verbal consent on 09/29/2023 for a virtual visit (video or telephone). Kara Rodney, FNP  Date: 09/29/2023 6:52 PM  Virtual Visit via Video Note   I, Kara Neal, connected with  Kara Neal  ([id number], January 23, 2001) on 09/29/23 at  6:30 PM EST by a video-enabled telemedicine application and verified that I am speaking with the correct person using two identifiers.  Location: Patient: Virtual Visit Location Patient:  Home Provider: Virtual Visit Location Provider: Home Office   I discussed the limitations of evaluation and management by telemedicine and the availability of in person appointments. The patient expressed understanding and agreed to proceed.    History of Present Illness: Kara Neal is a 22 y.o. who identifies as a female who was assigned female at birth, and is being seen today for recurrent BV. She was seen in the ED on 09/15/23 and was give Flagyl 500 mg BID for 7 days. She completed this Monday and continues to have discharge.   HPI: Vaginal Discharge The patient's primary symptoms include a genital odor and vaginal discharge. The patient's pertinent negatives include no genital itching. This is a recurrent problem. The current episode started 1 to 4 weeks ago. The patient is experiencing no pain. The vaginal discharge was yellow. The treatment provided no relief.    Problems: There are no problems to display for this patient.   Allergies:  Allergies  Allergen Reactions   Bee Pollen    Cat Hair Extract    Medications:  Current Outpatient Medications:    metroNIDAZOLE (METROGEL) 0.75 % vaginal gel, Place 1 Applicatorful vaginally at bedtime for 5 days., Disp: 50 g, Rfl: 0  Observations/Objective: Patient is well-developed, well-nourished in no acute distress.  Resting comfortably  at home.  Head is normocephalic, atraumatic.  No labored breathing.  Speech is clear and coherent with logical content.  Patient is alert and oriented at baseline.    Assessment and Plan: 1. BV (bacterial vaginosis) -  metroNIDAZOLE (METROGEL) 0.75 % vaginal gel; Place 1 Applicatorful vaginally at bedtime for 5 days.  Dispense: 50 g; Refill: 0  2. Hospital discharge follow-up  Hospital notes and labs reviewed Will give rx of Metrogel vaginally  Safe sex Probiotic  Follow up if symptoms worsen or do not improve   Follow Up Instructions: I discussed the assessment and treatment plan with  the patient. The patient was provided an opportunity to ask questions and all were answered. The patient agreed with the plan and demonstrated an understanding of the instructions.  A copy of instructions were sent to the patient via MyChart unless otherwise noted below.    The patient was advised to call back or seek an in-person evaluation if the symptoms worsen or if the condition fails to improve as anticipated.    Kara Rodney, FNP

## 2023-09-29 NOTE — Patient Instructions (Signed)

## 2023-11-13 ENCOUNTER — Ambulatory Visit (HOSPITAL_COMMUNITY): Payer: BLUE CROSS/BLUE SHIELD

## 2023-11-15 ENCOUNTER — Encounter (HOSPITAL_COMMUNITY): Payer: Self-pay | Admitting: Emergency Medicine

## 2023-11-15 ENCOUNTER — Ambulatory Visit (HOSPITAL_COMMUNITY)
Admission: EM | Admit: 2023-11-15 | Discharge: 2023-11-15 | Disposition: A | Discharge status: Home / Self Care | Payer: BLUE CROSS/BLUE SHIELD | Attending: Emergency Medicine | Admitting: Emergency Medicine

## 2023-11-15 ENCOUNTER — Other Ambulatory Visit: Payer: Self-pay

## 2023-11-15 DIAGNOSIS — N898 Other specified noninflammatory disorders of vagina: Secondary | ICD-10-CM

## 2023-11-15 DIAGNOSIS — Z3202 Encounter for pregnancy test, result negative: Secondary | ICD-10-CM

## 2023-11-15 DIAGNOSIS — R102 Pelvic and perineal pain: Secondary | ICD-10-CM | POA: Insufficient documentation

## 2023-11-15 DIAGNOSIS — Z113 Encounter for screening for infections with a predominantly sexual mode of transmission: Secondary | ICD-10-CM

## 2023-11-15 DIAGNOSIS — K529 Noninfective gastroenteritis and colitis, unspecified: Secondary | ICD-10-CM

## 2023-11-15 LAB — POCT URINALYSIS DIP (MANUAL ENTRY)
Bilirubin, UA: NEGATIVE
Glucose, UA: NEGATIVE mg/dL
Ketones, POC UA: NEGATIVE mg/dL
Leukocytes, UA: NEGATIVE
Nitrite, UA: NEGATIVE
Protein Ur, POC: NEGATIVE mg/dL
Spec Grav, UA: 1.02 (ref 1.010–1.025)
Urobilinogen, UA: 0.2 U/dL
pH, UA: 7.5 (ref 5.0–8.0)

## 2023-11-15 LAB — POCT URINE PREGNANCY: Preg Test, Ur: NEGATIVE

## 2023-11-15 MED ORDER — ONDANSETRON 4 MG PO TBDP
ORAL_TABLET | ORAL | Status: AC
  Filled 2023-11-15: qty 1

## 2023-11-15 MED ORDER — ONDANSETRON 4 MG PO TBDP: 4.0000 mg | ORAL_TABLET | Freq: Four times a day (QID) | ORAL | 0 refills | Status: DC | PRN

## 2023-11-15 MED ORDER — ONDANSETRON 4 MG PO TBDP
4.0000 mg | ORAL_TABLET | Freq: Once | ORAL | Status: AC
  Administered 2023-11-15: 4 mg via ORAL

## 2023-11-15 NOTE — Discharge Instructions (Addendum)
We will call you if anything on your swab returns positive. You can also see these results on MyChart. Please abstain from sexual intercourse until your results return.  The zofran can be used every 6 hours as needed to settle the stomach  Drink lots of water!

## 2023-11-15 NOTE — ED Triage Notes (Signed)
Patient has had vomiting, stomach has pressure.  Denies pain with urination.  Patient has irregular periods and states she does not know when it last was.  Patient is not on birth control

## 2023-11-15 NOTE — ED Provider Notes (Signed)
[facility]    CSN: [id number] Arrival date & time: 11/15/23  1152     History   Chief Complaint Chief Complaint  Patient presents with   Possible Pregnancy    HPI Kara Neal is a 22 y.o. female.  Presents for pregnancy test Unknown last menstrual cycle, has irregular cycles Does not use birth control Reports nausea with 2 episodes of vomiting. Last was 3 days ago. Some lower abdominal pressure.  Not having dysuria, urgency, frequency, flank pain or fever Reports 3 days of vaginal discharge.  No odor or itching  Past Medical History:  Diagnosis Date   Asthma     There are no active problems to display for this patient.   History reviewed. No pertinent surgical history.  OB History   No obstetric history on file.      Home Medications    Prior to Admission medications   Medication Sig Start Date End Date Taking? Authorizing Provider  ondansetron (ZOFRAN-ODT) 4 MG disintegrating tablet Take 1 tablet (4 mg total) by mouth every 6 (six) hours as needed for nausea or vomiting. 11/15/23  Yes Shevon Sian, Lurena Joiner, PA-C    Family History Family History  Problem Relation Age of Onset   Asthma Father     Social History Social History   Tobacco Use   Smoking status: Never   Smokeless tobacco: Never  Vaping Use   Vaping status: Former  Substance Use Topics   Alcohol use: Yes    Comment: socially   Drug use: Not Currently    Types: Marijuana     Allergies   Bee pollen and Cat hair extract   Review of Systems Review of Systems Per HPI  Physical Exam Triage Vital Signs ED Triage Vitals  Encounter Vitals Group     BP 11/15/23 1245 103/63     Systolic BP Percentile --      Diastolic BP Percentile --      Pulse Rate 11/15/23 1245 78     Resp 11/15/23 1245 18     Temp 11/15/23 1245 98.8 F (37.1 C)     Temp Source 11/15/23 1245 Oral     SpO2 11/15/23 1245 98 %     Weight --      Height --      Head Circumference --      Peak Flow --       Pain Score 11/15/23 1242 5     Pain Loc --      Pain Education --      Exclude from Growth Chart --    No data found.  Updated Vital Signs BP 103/63 (BP Location: Left Arm)   Pulse 78   Temp 98.8 F (37.1 C) (Oral)   Resp 18   LMP  (LMP Unknown)   SpO2 98%   Visual Acuity Right Eye Distance:   Left Eye Distance:   Bilateral Distance:    Right Eye Near:   Left Eye Near:    Bilateral Near:     Physical Exam Vitals and nursing note reviewed.  Constitutional:      Appearance: Normal appearance.  HENT:     Mouth/Throat:     Mouth: Mucous membranes are moist.     Pharynx: Oropharynx is clear.  Eyes:     Conjunctiva/sclera: Conjunctivae normal.  Cardiovascular:     Rate and Rhythm: Normal rate and regular rhythm.     Heart sounds: Normal heart sounds.  Pulmonary:     Effort:  Pulmonary effort is normal.     Breath sounds: Normal breath sounds.  Abdominal:     Palpations: Abdomen is soft.     Tenderness: There is no abdominal tenderness. There is no right CVA tenderness, left CVA tenderness, guarding or rebound.  Musculoskeletal:        General: Normal range of motion.  Skin:    General: Skin is warm and dry.  Neurological:     Mental Status: She is alert and oriented to person, place, and time.      UC Treatments / Results  Labs (all labs ordered are listed, but only abnormal results are displayed) Labs Reviewed  POCT URINALYSIS DIP (MANUAL ENTRY) - Abnormal; Notable for the following components:      Result Value   Blood, UA trace-intact (*)    All other components within normal limits  URINE CULTURE  POCT URINE PREGNANCY  CERVICOVAGINAL ANCILLARY ONLY    EKG   Radiology No results found.  Procedures Procedures (including critical care time)  Medications Ordered in UC Medications  ondansetron (ZOFRAN-ODT) disintegrating tablet 4 mg (4 mg Oral Given 11/15/23 1324)    Initial Impression / Assessment and Plan / UC Course  I have reviewed  the triage vital signs and the nursing notes.  Pertinent labs & imaging results that were available during my care of the patient were reviewed by me and considered in my medical decision making (see chart for details).  UPT negative UA trace RBC. Will culture due to suprapubic discomfort. Defer treatment at this time given lack of overt urinary symptoms.  Cytology swab is pending. Treat positive as indicated.  Zofran ODT given in clinic. Sent to pharmacy to use at home if needed. Increase fluids. Work note provided. Return if needed  Final Clinical Impressions(s) / UC Diagnoses   Final diagnoses:  Negative pregnancy test  Gastroenteritis  Vaginal discharge  Screen for STD (sexually transmitted disease)     Discharge Instructions      We will call you if anything on your swab returns positive. You can also see these results on MyChart. Please abstain from sexual intercourse until your results return.  The zofran can be used every 6 hours as needed to settle the stomach  Drink lots of water!     ED Prescriptions     Medication Sig Dispense Auth. Provider   ondansetron (ZOFRAN-ODT) 4 MG disintegrating tablet Take 1 tablet (4 mg total) by mouth every 6 (six) hours as needed for nausea or vomiting. 20 tablet Chenay Nesmith, Lurena Joiner, PA-C      PDMP not reviewed this encounter.   Shalissa Easterwood, Lurena Joiner, [place] 11/15/23 1326

## 2023-11-15 NOTE — ED Triage Notes (Signed)
Noticed vaginal discharge 3 days ago

## 2023-11-16 ENCOUNTER — Telehealth (HOSPITAL_COMMUNITY): Payer: Self-pay

## 2023-11-16 LAB — URINE CULTURE

## 2023-11-16 LAB — CERVICOVAGINAL ANCILLARY ONLY
Bacterial Vaginitis (gardnerella): POSITIVE — AB
Candida Glabrata: NEGATIVE
Candida Vaginitis: POSITIVE — AB
Chlamydia: NEGATIVE
Comment: NEGATIVE
Comment: NEGATIVE
Comment: NEGATIVE
Comment: NEGATIVE
Comment: NEGATIVE
Comment: NORMAL
Neisseria Gonorrhea: NEGATIVE
Trichomonas: NEGATIVE

## 2023-11-16 MED ORDER — METRONIDAZOLE 500 MG PO TABS: 500.0000 mg | ORAL_TABLET | Freq: Two times a day (BID) | ORAL | 0 refills | Status: AC

## 2023-11-16 MED ORDER — FLUCONAZOLE 150 MG PO TABS: 150.0000 mg | ORAL_TABLET | Freq: Once | ORAL | 0 refills | Status: AC

## 2023-11-16 NOTE — Telephone Encounter (Signed)
Per protocol, pt requires tx with metronidazole and Diflucan.  Rx sent to pharmacy on file.

## 2023-11-26 ENCOUNTER — Ambulatory Visit (HOSPITAL_COMMUNITY)
Admission: EM | Admit: 2023-11-26 | Discharge: 2023-11-26 | Disposition: A | Discharge status: Home / Self Care | Payer: BLUE CROSS/BLUE SHIELD | Attending: Internal Medicine | Admitting: Internal Medicine

## 2023-11-26 ENCOUNTER — Encounter (HOSPITAL_COMMUNITY): Payer: Self-pay

## 2023-11-26 DIAGNOSIS — J029 Acute pharyngitis, unspecified: Secondary | ICD-10-CM | POA: Insufficient documentation

## 2023-11-26 LAB — POCT RAPID STREP A (OFFICE): Rapid Strep A Screen: NEGATIVE

## 2023-11-26 LAB — POCT MONO SCREEN (KUC): Mono, POC: NEGATIVE

## 2023-11-26 MED ORDER — PREDNISOLONE SODIUM PHOSPHATE 15 MG/5ML PO SOLN
ORAL | Status: AC
  Filled 2023-11-26: qty 2

## 2023-11-26 MED ORDER — PREDNISOLONE SODIUM PHOSPHATE 15 MG/5ML PO SOLN
30.0000 mg | Freq: Once | ORAL | Status: AC
  Administered 2023-11-26: 30 mg via ORAL

## 2023-11-26 NOTE — Discharge Instructions (Addendum)
 Your rapid strep test was negative, we are sending this up for culture and we will contact you if antibiotics are indicated.  Mono testing was also negative. The prednisone  will help with the pain and swelling.  You can also alternate between 800 mg of ibuprofen and 500 mg of Tylenol  every 4-6 hours for pain and inflammation.  Warm saline gargles, tea with honey and sleeping with a humidifier can help with throat pain.  Ensure you are resting your voice as well.  Symptoms should improve over the next 5 to 7 days, if no improvement or any changes please return to clinic or follow-up with primary care provider for repeat evaluation.

## 2023-11-26 NOTE — ED Triage Notes (Signed)
 Pt states sore throat for the past 2 days. States she has not taken anything at home for it.

## 2023-11-26 NOTE — ED Provider Notes (Signed)
 [facility]    CSN: [id number] Arrival date & time: 11/26/23  0931      History   Chief Complaint Chief Complaint  Patient presents with   Sore Throat    HPI Kara Neal is a 23 y.o. female.   Patient presents to clinic for sore throat that started yesterday. She has pain with swallowing and throat discomfort. Reports her tonsils were swollen so she decided to come to clinic. She has been fatigued. Has tried tea with honey without much improvement. Voice loss.   Denies any fevers. No cough or congestion.   The history is provided by the patient and medical records.  Sore Throat    Past Medical History:  Diagnosis Date   Asthma     There are no active problems to display for this patient.   History reviewed. No pertinent surgical history.  OB History   No obstetric history on file.      Home Medications    Prior to Admission medications   Medication Sig Start Date End Date Taking? Authorizing Provider  ondansetron  (ZOFRAN -ODT) 4 MG disintegrating tablet Take 1 tablet (4 mg total) by mouth every 6 (six) hours as needed for nausea or vomiting. 11/15/23   Rising, Asberry, PA-C    Family History Family History  Problem Relation Age of Onset   Asthma Father     Social History Social History   Tobacco Use   Smoking status: Never   Smokeless tobacco: Never  Vaping Use   Vaping status: Former  Substance Use Topics   Alcohol use: Yes    Comment: socially   Drug use: Not Currently    Types: Marijuana     Allergies   Bee pollen and Cat hair extract   Review of Systems Review of Systems  Per HPI   Physical Exam Triage Vital Signs ED Triage Vitals  Encounter Vitals Group     BP 11/26/23 1017 (!) 112/56     Systolic BP Percentile --      Diastolic BP Percentile --      Pulse Rate 11/26/23 1017 81     Resp 11/26/23 1017 16     Temp 11/26/23 1017 98 F (36.7 C)     Temp Source 11/26/23 1017 Oral     SpO2 11/26/23 1017 98 %      Weight --      Height --      Head Circumference --      Peak Flow --      Pain Score 11/26/23 1019 8     Pain Loc --      Pain Education --      Exclude from Growth Chart --    No data found.  Updated Vital Signs BP (!) 112/56 (BP Location: Left Arm)   Pulse 81   Temp 98 F (36.7 C) (Oral)   Resp 16   LMP 11/19/2023 (Approximate)   SpO2 98%   Visual Acuity Right Eye Distance:   Left Eye Distance:   Bilateral Distance:    Right Eye Near:   Left Eye Near:    Bilateral Near:     Physical Exam Vitals and nursing note reviewed.  Constitutional:      Appearance: She is well-developed.  HENT:     Head: Normocephalic and atraumatic.     Nose: No congestion or rhinorrhea.     Mouth/Throat:     Mouth: Mucous membranes are moist.     Pharynx: Uvula midline.  Posterior oropharyngeal erythema present.     Tonsils: Tonsillar exudate present. 2+ on the right. 2+ on the left.  Cardiovascular:     Rate and Rhythm: Normal rate.  Pulmonary:     Effort: Pulmonary effort is normal. No respiratory distress.  Skin:    General: Skin is warm and dry.  Neurological:     General: No focal deficit present.     Mental Status: She is alert and oriented to person, place, and time.  Psychiatric:        Mood and Affect: Mood normal.        Behavior: Behavior normal.      UC Treatments / Results  Labs (all labs ordered are listed, but only abnormal results are displayed) Labs Reviewed  POCT RAPID STREP A (OFFICE) - Normal  CULTURE, GROUP A STREP [facility])  POCT MONO SCREEN [facility])    EKG   Radiology No results found.  Procedures Procedures (including critical care time)  Medications Ordered in UC Medications  prednisoLONE  (ORAPRED ) 15 MG/5ML solution 30 mg (30 mg Oral Given 11/26/23 1041)    Initial Impression / Assessment and Plan / UC Course  I have reviewed the triage vital signs and the nursing notes.  Pertinent labs & imaging results that were available during my care  of the patient were reviewed by me and considered in my medical decision making (see chart for details).  Vitals and triage reviewed, patient is hemodynamically stable.  Tonsils with bilateral 2+ swelling and scattered exudate.  Uvula midline, low concern for PTA.  POC rapid strep negative, will send for culture.  Patient endorsing fatigue, mono testing negative.  Afebrile.  Given a dose of oral steroids in clinic for tonsil swelling and discomfort.  Staff will contact if antibiotics are indicated based on throat culture.  Plan of care, follow-up care return precautions given, no questions at this time.     Final Clinical Impressions(s) / UC Diagnoses   Final diagnoses:  Pharyngitis, unspecified etiology     Discharge Instructions      Your rapid strep test was negative, we are sending this up for culture and we will contact you if antibiotics are indicated.  Mono testing was also negative. The prednisone  will help with the pain and swelling.  You can also alternate between 800 mg of ibuprofen and 500 mg of Tylenol  every 4-6 hours for pain and inflammation.  Warm saline gargles, tea with honey and sleeping with a humidifier can help with throat pain.  Ensure you are resting your voice as well.  Symptoms should improve over the next 5 to 7 days, if no improvement or any changes please return to clinic or follow-up with primary care provider for repeat evaluation.     ED Prescriptions   None    PDMP not reviewed this encounter.   Dreama Maylen Waltermire  N, FNP 11/26/23 1314

## 2023-11-29 LAB — CULTURE, GROUP A STREP (THRC)

## 2023-12-16 ENCOUNTER — Other Ambulatory Visit: Payer: Self-pay

## 2023-12-16 ENCOUNTER — Emergency Department (HOSPITAL_COMMUNITY)
Admission: EM | Admit: 2023-12-16 | Discharge: 2023-12-16 | Disposition: A | Discharge status: Home / Self Care | Payer: BLUE CROSS/BLUE SHIELD | Attending: Emergency Medicine | Admitting: Emergency Medicine

## 2023-12-16 ENCOUNTER — Emergency Department (HOSPITAL_COMMUNITY): Payer: BLUE CROSS/BLUE SHIELD

## 2023-12-16 ENCOUNTER — Encounter (HOSPITAL_COMMUNITY): Payer: Self-pay

## 2023-12-16 DIAGNOSIS — J101 Influenza due to other identified influenza virus with other respiratory manifestations: Secondary | ICD-10-CM

## 2023-12-16 DIAGNOSIS — Z20822 Contact with and (suspected) exposure to covid-19: Secondary | ICD-10-CM | POA: Insufficient documentation

## 2023-12-16 DIAGNOSIS — R059 Cough, unspecified: Secondary | ICD-10-CM | POA: Diagnosis present

## 2023-12-16 DIAGNOSIS — J45909 Unspecified asthma, uncomplicated: Secondary | ICD-10-CM | POA: Diagnosis not present

## 2023-12-16 DIAGNOSIS — J09X2 Influenza due to identified novel influenza A virus with other respiratory manifestations: Secondary | ICD-10-CM | POA: Diagnosis not present

## 2023-12-16 LAB — COMPREHENSIVE METABOLIC PANEL
ALT: 21 U/L (ref 0–44)
AST: 19 U/L (ref 15–41)
Albumin: 3.9 g/dL (ref 3.5–5.0)
Alkaline Phosphatase: 40 U/L (ref 38–126)
Anion gap: 11 (ref 5–15)
BUN: 7 mg/dL (ref 6–20)
CO2: 21 mmol/L — ABNORMAL LOW (ref 22–32)
Calcium: 9.4 mg/dL (ref 8.9–10.3)
Chloride: 105 mmol/L (ref 98–111)
Creatinine, Ser: 0.71 mg/dL (ref 0.44–1.00)
GFR, Estimated: >60 mL/min (ref >60)
Glucose, Bld: 91 mg/dL (ref 70–99)
Potassium: 3.2 mmol/L — ABNORMAL LOW (ref 3.5–5.1)
Sodium: 137 mmol/L (ref 135–145)
Total Bilirubin: 0.9 mg/dL (ref 0.0–1.2)
Total Protein: 6.9 g/dL (ref 6.5–8.1)

## 2023-12-16 LAB — CBC WITH DIFFERENTIAL/PLATELET
Abs Immature Granulocytes: 0.03 10*3/uL (ref 0.00–0.07)
Basophils Absolute: 0 10*3/uL (ref 0.0–0.1)
Basophils Relative: 1 %
Eosinophils Absolute: 0 10*3/uL (ref 0.0–0.5)
Eosinophils Relative: 0 %
HCT: 40 % (ref 36.0–46.0)
Hemoglobin: 13.3 g/dL (ref 12.0–15.0)
Immature Granulocytes: 1 %
Lymphocytes Relative: 13 %
Lymphs Abs: 0.8 10*3/uL (ref 0.7–4.0)
MCH: 31 pg (ref 26.0–34.0)
MCHC: 33.3 g/dL (ref 30.0–36.0)
MCV: 93.2 fL (ref 80.0–100.0)
Monocytes Absolute: 0.9 10*3/uL (ref 0.1–1.0)
Monocytes Relative: 13 %
Neutro Abs: 4.6 10*3/uL (ref 1.7–7.7)
Neutrophils Relative %: 72 %
Platelets: 224 10*3/uL (ref 150–400)
RBC: 4.29 MIL/uL (ref 3.87–5.11)
RDW: 11.9 % (ref 11.5–15.5)
WBC: 6.4 10*3/uL (ref 4.0–10.5)
nRBC: 0 % (ref 0.0–0.2)

## 2023-12-16 LAB — URINALYSIS, W/ REFLEX TO CULTURE (INFECTION SUSPECTED)
Bilirubin Urine: NEGATIVE
Glucose, UA: NEGATIVE mg/dL
Ketones, ur: 5 mg/dL — AB
Leukocytes,Ua: NEGATIVE
Nitrite: NEGATIVE
Protein, ur: 30 mg/dL — AB
Specific Gravity, Urine: 1.03 (ref 1.005–1.030)
pH: 5 (ref 5.0–8.0)

## 2023-12-16 LAB — RESP PANEL BY RT-PCR (RSV, FLU A&B, COVID)  RVPGX2
Influenza A by PCR: POSITIVE — AB
Influenza B by PCR: NEGATIVE
Resp Syncytial Virus by PCR: NEGATIVE
SARS Coronavirus 2 by RT PCR: NEGATIVE

## 2023-12-16 LAB — I-STAT CG4 LACTIC ACID, ED
Lactic Acid, Venous: 2.2 mmol/L (ref 0.5–1.9)
Lactic Acid, Venous: 0.8 mmol/L (ref 0.5–1.9)

## 2023-12-16 LAB — HCG, SERUM, QUALITATIVE: Preg, Serum: NEGATIVE

## 2023-12-16 MED ORDER — IPRATROPIUM-ALBUTEROL 0.5-2.5 (3) MG/3ML IN SOLN
3.0000 mL | Freq: Once | RESPIRATORY_TRACT | Status: AC
  Administered 2023-12-16: 3 mL via RESPIRATORY_TRACT
  Filled 2023-12-16: qty 3

## 2023-12-16 MED ORDER — ALBUTEROL SULFATE (2.5 MG/3ML) 0.083% IN NEBU: 2.5000 mg | INHALATION_SOLUTION | Freq: Four times a day (QID) | RESPIRATORY_TRACT | 12 refills | Status: AC | PRN

## 2023-12-16 MED ORDER — POTASSIUM CHLORIDE CRYS ER 20 MEQ PO TBCR
40.0000 meq | EXTENDED_RELEASE_TABLET | Freq: Once | ORAL | Status: AC
  Administered 2023-12-16: 40 meq via ORAL
  Filled 2023-12-16: qty 2

## 2023-12-16 MED ORDER — ACETAMINOPHEN 500 MG PO TABS
1000.0000 mg | ORAL_TABLET | Freq: Once | ORAL | Status: AC
  Administered 2023-12-16: 1000 mg via ORAL
  Filled 2023-12-16: qty 2

## 2023-12-16 MED ORDER — KETOROLAC TROMETHAMINE 15 MG/ML IJ SOLN
15.0000 mg | Freq: Once | INTRAMUSCULAR | Status: AC
  Administered 2023-12-16: 15 mg via INTRAMUSCULAR
  Filled 2023-12-16: qty 1

## 2023-12-16 NOTE — Progress Notes (Addendum)
Visited patient and provided spiritual support.

## 2023-12-16 NOTE — ED Triage Notes (Signed)
Pt. Stated, Kara Neal been running a fever , wheezing, chest pain, cough, and nasal congestion since Monday. Im out of my inhaler.

## 2023-12-16 NOTE — Discharge Instructions (Signed)
You have flu.  Take Tylenol and ibuprofen for fever and bodyaches.  I have sent your nebulizer treatment into the pharmacy for you.  Drink plenty of fluids.  Return for any emergent symptoms.  Otherwise follow-up with your primary care provider.

## 2023-12-16 NOTE — ED Provider Notes (Signed)
[place] [facility] AT [facility] Provider Note   CSN: [id number] Arrival date & time: 12/16/23  4742     History  Chief Complaint  Patient presents with   Chest Pain   Cough   Nasal Congestion   Fever   asthma   Generalized Body Aches    Kara Neal is a 23 y.o. female.  23 year old female presents today for concern of symptoms consistent with a URI including body aches, fever, nasal congestion, cough ongoing since Monday.  Also endorses chest discomfort.  She states she ran out of her albuterol inhaler today but it was also not significantly helping.  She states typically her nebulizer treatments help but she has run out of those a while ago.  Chest pain started yesterday.  This was about 3 days into her illness.  The history is provided by the patient. No language interpreter was used.       Home Medications Prior to Admission medications   Medication Sig Start Date End Date Taking? Authorizing Provider  albuterol (PROVENTIL) (2.5 MG/3ML) 0.083% nebulizer solution Take 3 mLs (2.5 mg total) by nebulization every 6 (six) hours as needed for wheezing or shortness of breath. 12/16/23  Yes Karie Mainland, Genevia Bouldin, PA-C  ondansetron (ZOFRAN-ODT) 4 MG disintegrating tablet Take 1 tablet (4 mg total) by mouth every 6 (six) hours as needed for nausea or vomiting. 11/15/23   Rising, Lurena Joiner, PA-C      Allergies    Bee pollen and Cat dander    Review of Systems   Review of Systems  Constitutional:  Negative for fever.  HENT:  Positive for congestion. Negative for sore throat, trouble swallowing and voice change.   Respiratory:  Positive for cough, shortness of breath and wheezing.   Neurological:  Negative for light-headedness.  All other systems reviewed and are negative.   Physical Exam Updated Vital Signs BP 115/77   Pulse 100   Temp (!) 103 F (39.4 C) (Oral)   Resp 16   Ht 5\' 7"  (1.702 m)   Wt 66.7 kg   LMP 11/19/2023 (Approximate)   SpO2 99%   BMI  23.02 kg/m  Physical Exam Vitals and nursing note reviewed.  Constitutional:      General: She is not in acute distress.    Appearance: Normal appearance. She is not ill-appearing.  HENT:     Head: Normocephalic and atraumatic.     Nose: Nose normal.  Eyes:     Conjunctiva/sclera: Conjunctivae normal.  Cardiovascular:     Rate and Rhythm: Normal rate and regular rhythm.  Pulmonary:     Effort: Pulmonary effort is normal. No respiratory distress.     Breath sounds: Wheezing present.  Abdominal:     General: There is no distension.     Palpations: Abdomen is soft.     Tenderness: There is no abdominal tenderness.  Musculoskeletal:        General: No deformity.  Skin:    Findings: No rash.  Neurological:     Mental Status: She is alert.     ED Results / Procedures / Treatments   Labs (all labs ordered are listed, but only abnormal results are displayed) Labs Reviewed  RESP PANEL BY RT-PCR (RSV, FLU A&B, COVID)  RVPGX2 - Abnormal; Notable for the following components:      Result Value   Influenza A by PCR POSITIVE (*)    All other components within normal limits  COMPREHENSIVE METABOLIC PANEL - Abnormal; Notable for  the following components:   Potassium 3.2 (*)    CO2 21 (*)    All other components within normal limits  URINALYSIS, W/ REFLEX TO CULTURE (INFECTION SUSPECTED) - Abnormal; Notable for the following components:   Color, Urine AMBER (*)    APPearance HAZY (*)    Hgb urine dipstick MODERATE (*)    Ketones, ur 5 (*)    Protein, ur 30 (*)    Bacteria, UA FEW (*)    All other components within normal limits  I-STAT CG4 LACTIC ACID, ED - Abnormal; Notable for the following components:   Lactic Acid, Venous 2.2 (*)    All other components within normal limits  CBC WITH DIFFERENTIAL/PLATELET  HCG, SERUM, QUALITATIVE  I-STAT CG4 LACTIC ACID, ED    EKG EKG Interpretation Date/Time:  Thursday December 16 2023 18:14:57 EST Ventricular Rate:  95 PR  Interval:  146 QRS Duration:  78 QT Interval:  314 QTC Calculation: 394 R Axis:   81  Text Interpretation: Normal sinus rhythm Normal ECG Confirmed by Vonita Moss [phone]) on 12/16/2023 7:03:34 PM  Radiology DG Chest 2 View Result Date: 12/16/2023 CLINICAL DATA:  Four day history of chest pain, shortness of breath, and body aches EXAM: CHEST - 2 VIEW COMPARISON:  Chest radiograph dated 03/11/2023 FINDINGS: Normal lung volumes. No focal consolidations. No pleural effusion or pneumothorax. The heart size and mediastinal contours are within normal limits. No acute osseous abnormality. IMPRESSION: No active cardiopulmonary disease. Electronically Signed   By: Agustin Cree M.D.   On: 12/16/2023 11:13    Procedures Procedures    Medications Ordered in ED Medications  ketorolac (TORADOL) 15 MG/ML injection 15 mg (15 mg Intramuscular Given 12/16/23 1827)  ipratropium-albuterol (DUONEB) 0.5-2.5 (3) MG/3ML nebulizer solution 3 mL (3 mLs Nebulization Given 12/16/23 1827)  potassium chloride SA (KLOR-CON M) CR tablet 40 mEq (40 mEq Oral Given 12/16/23 1827)  acetaminophen (TYLENOL) tablet 1,000 mg (1,000 mg Oral Given 12/16/23 1832)    ED Course/ Medical Decision Making/ A&P                                 Medical Decision Making Amount and/or Complexity of Data Reviewed Labs: ordered. Radiology: ordered.  Risk OTC drugs. Prescription drug management.   Medical Decision Making / ED Course   This patient presents to the ED for concern of cough, congestion, this involves an extensive number of treatment options, and is a complaint that carries with it a high risk of complications and morbidity.  The differential diagnosis includes viral URI, pneumonia, ACS due to chest pain.  Low risk overall.  MDM: 23 year old female presents today for concern of URI symptoms.  Respiratory panel positive for influenza A.  Overall appears well.  There are generalized wheezing on exam.  Will provide  nebulizer treatment.  CBC is unremarkable, CMP without acute concern with the exception of potassium 3.2.  P.o. repletion ordered.  UA without evidence of UTI.  Pregnancy test negative.  Chest x-ray without acute cardiopulmonary process.  EKG obtained given concern for chest pain.  This is unremarkable.  I did offer troponin however patient defers as she states she has had blood work drawn already and does not want any additional blood work drawn but will allow for an EKG.  Low suspicion for ACS.  Admission considered but after workup and DuoNeb patient has significantly improved.  She feels comfortable with discharge.  Strict  return precaution discussed.  Lab Tests: -I ordered, reviewed, and interpreted labs.   The pertinent results include:   Labs Reviewed  RESP PANEL BY RT-PCR (RSV, FLU A&B, COVID)  RVPGX2 - Abnormal; Notable for the following components:      Result Value   Influenza A by PCR POSITIVE (*)    All other components within normal limits  COMPREHENSIVE METABOLIC PANEL - Abnormal; Notable for the following components:   Potassium 3.2 (*)    CO2 21 (*)    All other components within normal limits  URINALYSIS, W/ REFLEX TO CULTURE (INFECTION SUSPECTED) - Abnormal; Notable for the following components:   Color, Urine AMBER (*)    APPearance HAZY (*)    Hgb urine dipstick MODERATE (*)    Ketones, ur 5 (*)    Protein, ur 30 (*)    Bacteria, UA FEW (*)    All other components within normal limits  I-STAT CG4 LACTIC ACID, ED - Abnormal; Notable for the following components:   Lactic Acid, Venous 2.2 (*)    All other components within normal limits  CBC WITH DIFFERENTIAL/PLATELET  HCG, SERUM, QUALITATIVE  I-STAT CG4 LACTIC ACID, ED      EKG  EKG Interpretation Date/Time:  Thursday December 16 2023 18:14:57 EST Ventricular Rate:  95 PR Interval:  146 QRS Duration:  78 QT Interval:  314 QTC Calculation: 394 R Axis:   81  Text Interpretation: Normal sinus rhythm Normal  ECG Confirmed by Vonita Moss [phone]) on 12/16/2023 7:03:34 PM         Imaging Studies ordered: I ordered imaging studies including chest x-ray I independently visualized and interpreted imaging. I agree with the radiologist interpretation   Medicines ordered and prescription drug management: Meds ordered this encounter  Medications   ketorolac (TORADOL) 15 MG/ML injection 15 mg   ipratropium-albuterol (DUONEB) 0.5-2.5 (3) MG/3ML nebulizer solution 3 mL   potassium chloride SA (KLOR-CON M) CR tablet 40 mEq   acetaminophen (TYLENOL) tablet 1,000 mg   albuterol (PROVENTIL) (2.5 MG/3ML) 0.083% nebulizer solution    Sig: Take 3 mLs (2.5 mg total) by nebulization every 6 (six) hours as needed for wheezing or shortness of breath.    Dispense:  75 mL    Refill:  12    Supervising Provider:   Hyacinth Meeker, BRIAN [[id number]]    -I have reviewed the patients home medicines and have made adjustments as needed   Reevaluation: After the interventions noted above, I reevaluated the patient and found that they have :improved  Co morbidities that complicate the patient evaluation  Past Medical History:  Diagnosis Date   Asthma       Dispostion: Discharged in stable condition.  Return precaution discussed.  Patient voices understanding and is in agreement with plan.   Final Clinical Impression(s) / ED Diagnoses Final diagnoses:  Influenza A    Rx / DC Orders ED Discharge Orders          Ordered    albuterol (PROVENTIL) (2.5 MG/3ML) 0.083% nebulizer solution  Every 6 hours PRN        12/16/23 2052              Marita Kansas, PA-C 12/16/23 2058    Rondel Baton, MD 12/22/23 1355

## 2024-01-16 ENCOUNTER — Ambulatory Visit (HOSPITAL_COMMUNITY): Payer: BLUE CROSS/BLUE SHIELD

## 2024-01-18 ENCOUNTER — Ambulatory Visit (HOSPITAL_COMMUNITY)
Admission: EM | Admit: 2024-01-18 | Discharge: 2024-01-18 | Disposition: A | Discharge status: Home / Self Care | Payer: BLUE CROSS/BLUE SHIELD | Attending: Physician Assistant | Admitting: Physician Assistant

## 2024-01-18 ENCOUNTER — Encounter (HOSPITAL_COMMUNITY): Payer: Self-pay

## 2024-01-18 DIAGNOSIS — K219 Gastro-esophageal reflux disease without esophagitis: Secondary | ICD-10-CM | POA: Insufficient documentation

## 2024-01-18 DIAGNOSIS — M5489 Other dorsalgia: Secondary | ICD-10-CM | POA: Insufficient documentation

## 2024-01-18 LAB — POCT URINALYSIS DIP (MANUAL ENTRY)
Bilirubin, UA: NEGATIVE
Glucose, UA: NEGATIVE mg/dL
Ketones, POC UA: NEGATIVE mg/dL
Leukocytes, UA: NEGATIVE
Nitrite, UA: NEGATIVE
Protein Ur, POC: NEGATIVE mg/dL
Spec Grav, UA: 1.02 (ref 1.010–1.025)
Urobilinogen, UA: 0.2 U/dL
pH, UA: 7 (ref 5.0–8.0)

## 2024-01-18 LAB — POCT URINE PREGNANCY: Preg Test, Ur: NEGATIVE

## 2024-01-18 LAB — HIV ANTIBODY (ROUTINE TESTING W REFLEX): HIV Screen 4th Generation wRfx: NONREACTIVE

## 2024-01-18 NOTE — ED Provider Notes (Signed)
 [facility]    CSN: [id number] Arrival date & time: 01/18/24  1245      History   Chief Complaint Chief Complaint  Patient presents with  . Back Pain  . Chest Pain    Ribs  . SEXUALLY TRANSMITTED DISEASE    HPI Kara Neal is a 23 y.o. female.   HPI  She reports feeling sharp pins and needles sensation in back and rib cage bilaterally for about a week She denies recent injuries but reports she did have the flu about a month ago  She denies persistent coughing after flu-like illness Aggravating: bending and lifting anything  Alleviating: nothing Interventions: stretches  She has not taken anything for pain or tried warm compresses to the area  She reports pain seems to start in her neck and radiates down her back, then wraps around her ribs (almost along bilateral traps) She states she does have epigastric pain as well. No changes with food or drinks  She denies previous hx of kidney stones   She reports some chunky white vaginal discharge once today  She would prefer gel if she needs flagyl   Past Medical History:  Diagnosis Date  . Asthma     There are no active problems to display for this patient.   History reviewed. No pertinent surgical history.  OB History   No obstetric history on file.      Home Medications    Prior to Admission medications   Medication Sig Start Date End Date Taking? Authorizing Provider  albuterol (PROVENTIL) (2.5 MG/3ML) 0.083% nebulizer solution Take 3 mLs (2.5 mg total) by nebulization every 6 (six) hours as needed for wheezing or shortness of breath. 12/16/23   Marita Kansas, PA-C    Family History Family History  Problem Relation Age of Onset  . Asthma Father     Social History Social History   Tobacco Use  . Smoking status: Never  . Smokeless tobacco: Never  Vaping Use  . Vaping status: Former  Substance Use Topics  . Alcohol use: Not Currently    Comment: socially  . Drug use: Not Currently     Types: Marijuana     Allergies   Bee pollen and Cat dander   Review of Systems Review of Systems  Constitutional:  Negative for chills and fever.  Respiratory:  Negative for cough, chest tightness, shortness of breath and wheezing.   Gastrointestinal:  Positive for abdominal pain. Negative for blood in stool, constipation, diarrhea, nausea and vomiting.  Genitourinary:  Positive for frequency. Negative for dysuria.  Musculoskeletal:  Positive for back pain and myalgias.  Skin:  Negative for rash.  Neurological:  Positive for dizziness. Negative for weakness, light-headedness and numbness.     Physical Exam Triage Vital Signs ED Triage Vitals  Encounter Vitals Group     BP 01/18/24 1442 107/72     Systolic BP Percentile --      Diastolic BP Percentile --      Pulse Rate 01/18/24 1442 73     Resp 01/18/24 1442 16     Temp 01/18/24 1442 98.8 F (37.1 C)     Temp Source 01/18/24 1442 Oral     SpO2 01/18/24 1442 98 %     Weight 01/18/24 1443 167 lb (75.8 kg)     Height 01/18/24 1443 5\' 7"  (1.702 m)     Head Circumference --      Peak Flow --      Pain Score 01/18/24  1445 8     Pain Loc --      Pain Education --      Exclude from Growth Chart --    No data found.  Updated Vital Signs BP 107/72 (BP Location: Left Arm)   Pulse 73   Temp 98.8 F (37.1 C) (Oral)   Resp 16   Ht 5\' 7"  (1.702 m)   Wt 167 lb (75.8 kg)   LMP 12/18/2023 (Approximate)   SpO2 98%   BMI 26.16 kg/m   Visual Acuity Right Eye Distance:   Left Eye Distance:   Bilateral Distance:    Right Eye Near:   Left Eye Near:    Bilateral Near:     Physical Exam Vitals reviewed.  Constitutional:      Appearance: She is well-developed.  HENT:     Head: Normocephalic and atraumatic.  Cardiovascular:     Rate and Rhythm: Normal rate and regular rhythm.     Heart sounds: Normal heart sounds. No murmur heard.    No friction rub. No gallop.  Pulmonary:     Effort: Pulmonary effort is normal.      Breath sounds: Normal breath sounds. No decreased air movement. No decreased breath sounds, wheezing, rhonchi or rales.  Neurological:     Mental Status: She is alert.     UC Treatments / Results  Labs (all labs ordered are listed, but only abnormal results are displayed) Labs Reviewed  POCT URINALYSIS DIP (MANUAL ENTRY) - Abnormal; Notable for the following components:      Result Value   Blood, UA small (*)    All other components within normal limits  RPR  HIV ANTIBODY (ROUTINE TESTING W REFLEX)  POCT URINE PREGNANCY  CERVICOVAGINAL ANCILLARY ONLY    EKG   Radiology No results found.  Procedures Procedures (including critical care time)  Medications Ordered in UC Medications - No data to display  Initial Impression / Assessment and Plan / UC Course  I have reviewed the triage vital signs and the nursing notes.  Pertinent labs & imaging results that were available during my care of the patient were reviewed by me and considered in my medical decision making (see chart for details).      Final Clinical Impressions(s) / UC Diagnoses   Final diagnoses:  None   Discharge Instructions   None    ED Prescriptions   None    PDMP not reviewed this encounter.

## 2024-01-18 NOTE — ED Triage Notes (Addendum)
 Patient here today with c/o upper back pain and bilat lower anterior rib pain X 1 week. No known injury. Patient has increased pain with bending and lifting things.   Patient would also like to be tested for Stds.

## 2024-01-18 NOTE — Discharge Instructions (Addendum)
 Based on your symptoms and physical exam I believe the following is the cause of your concern today Back pain likely secondary to a strain of your back muscles  I recommend the following at this time to help relieve that discomfort:  Rest Warm compresses to the area (20 minutes on, minimum of 30 minutes off) You can alternate Tylenol and Ibuprofen for pain management but Ibuprofen is typically preferred to reduce inflammation.  Tylenol is preferred due to your kidney function Gentle stretches and exercises that I have included in your paperwork Try to reduce excess strain to the area and rest as much as possible  Wear supportive shoes and, if you must lift anything, use proper lifting techniques that spare your back.   If these measures do not lead to improvement in your symptoms over the next 2-4 weeks please let us know    To help with your abdominal pain I recommend starting a medication called Pepcid.  This helps reduce acid and prevent reflux.  If you are having flares during that time you can take Tums or Pepto-Bismol.  Pepto-Bismol can turn your stools dark to black in color so please be mindful of this if this occurs.  I have also included some information about dietary changes to help manage GERD symptoms.  Please review those and try making some of this changes to further assist your symptoms and help reduce triggers.

## 2024-01-19 LAB — CERVICOVAGINAL ANCILLARY ONLY
Bacterial Vaginitis (gardnerella): POSITIVE — AB
Candida Glabrata: NEGATIVE
Candida Vaginitis: POSITIVE — AB
Chlamydia: NEGATIVE
Comment: NEGATIVE
Comment: NEGATIVE
Comment: NEGATIVE
Comment: NEGATIVE
Comment: NEGATIVE
Comment: NORMAL
Neisseria Gonorrhea: NEGATIVE
Trichomonas: NEGATIVE

## 2024-01-19 LAB — RPR: RPR Ser Ql: NONREACTIVE

## 2024-01-20 ENCOUNTER — Telehealth (HOSPITAL_COMMUNITY): Payer: Self-pay

## 2024-01-20 MED ORDER — FLUCONAZOLE 150 MG PO TABS: 150.0000 mg | ORAL_TABLET | Freq: Once | ORAL | 0 refills | Status: AC

## 2024-01-20 MED ORDER — METRONIDAZOLE 500 MG PO TABS: 500.0000 mg | ORAL_TABLET | Freq: Two times a day (BID) | ORAL | 0 refills | Status: AC

## 2024-01-20 NOTE — Telephone Encounter (Signed)
Per protocol, pt requires tx with metronidazole and diflucan.  Rx sent to pharmacy on file.

## 2024-01-22 IMAGING — DX DG CHEST 2V
2 series · 2 of 2 positions shown · non-contrast
Comparison: 04/27/2021

CLINICAL DATA: Cough and upper respiratory infection.

EXAM:
CHEST - 2 VIEW

[chest pa]
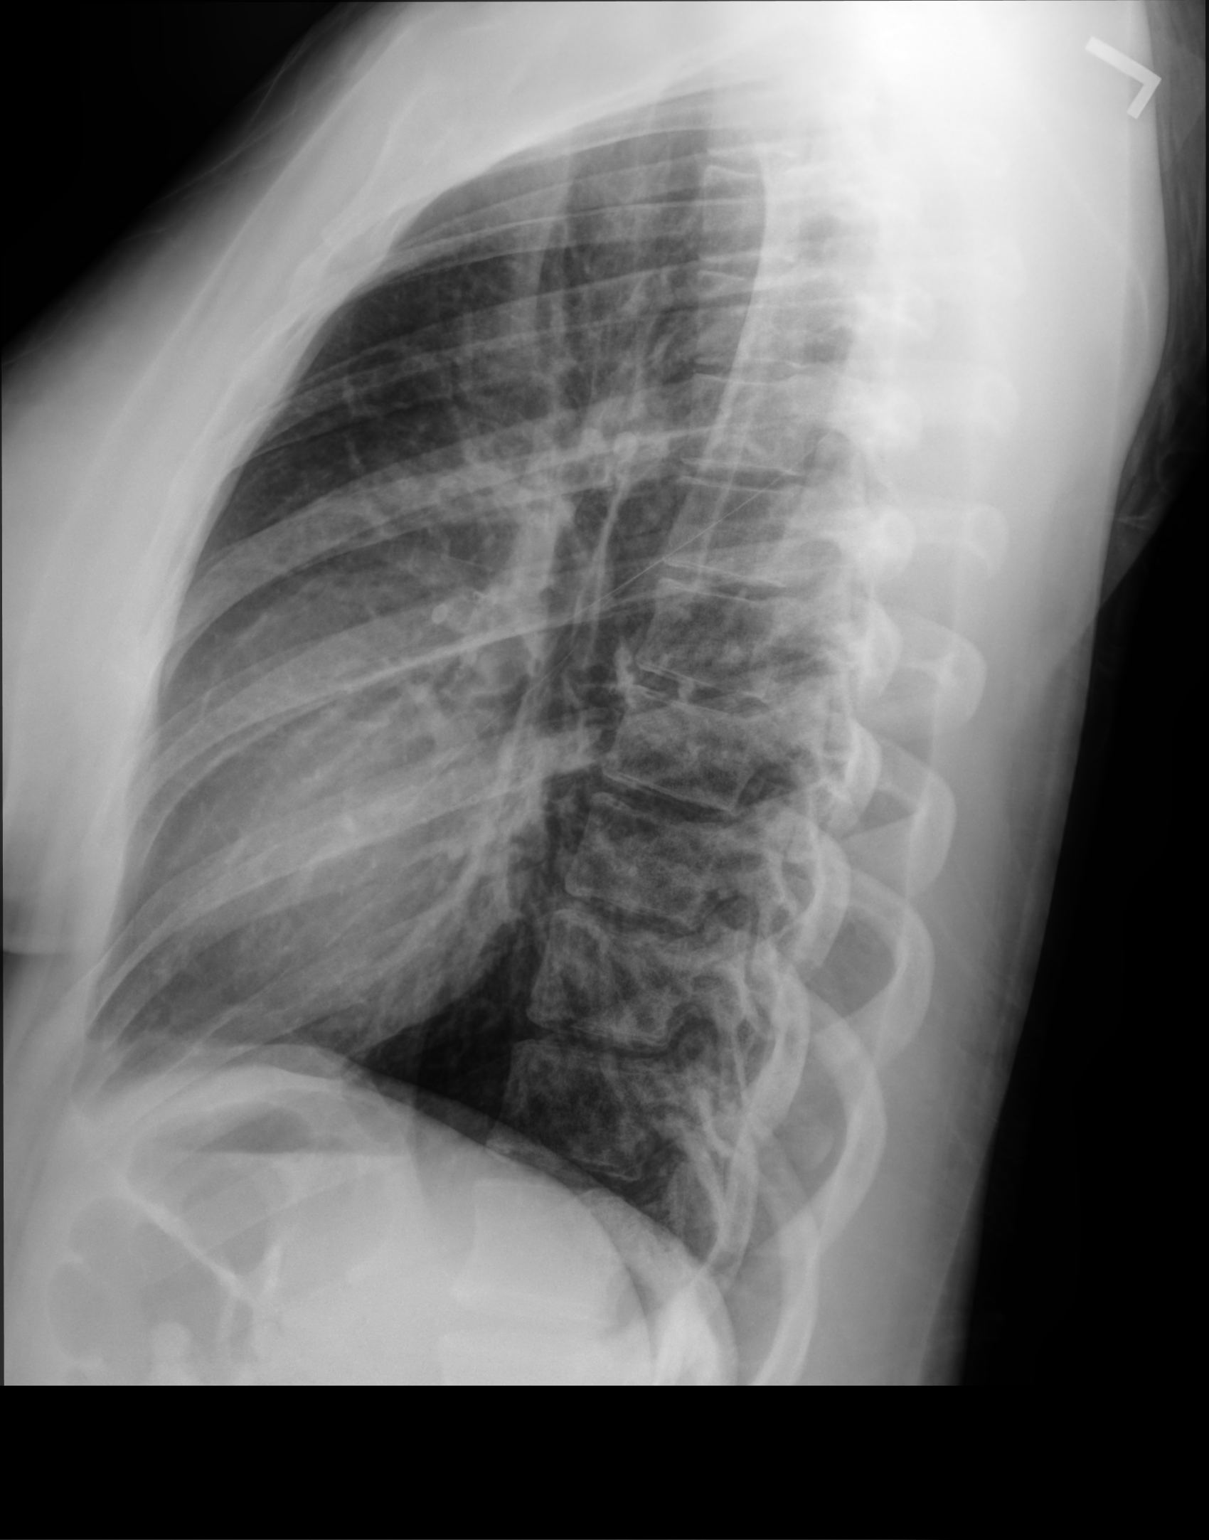

[chest lat]
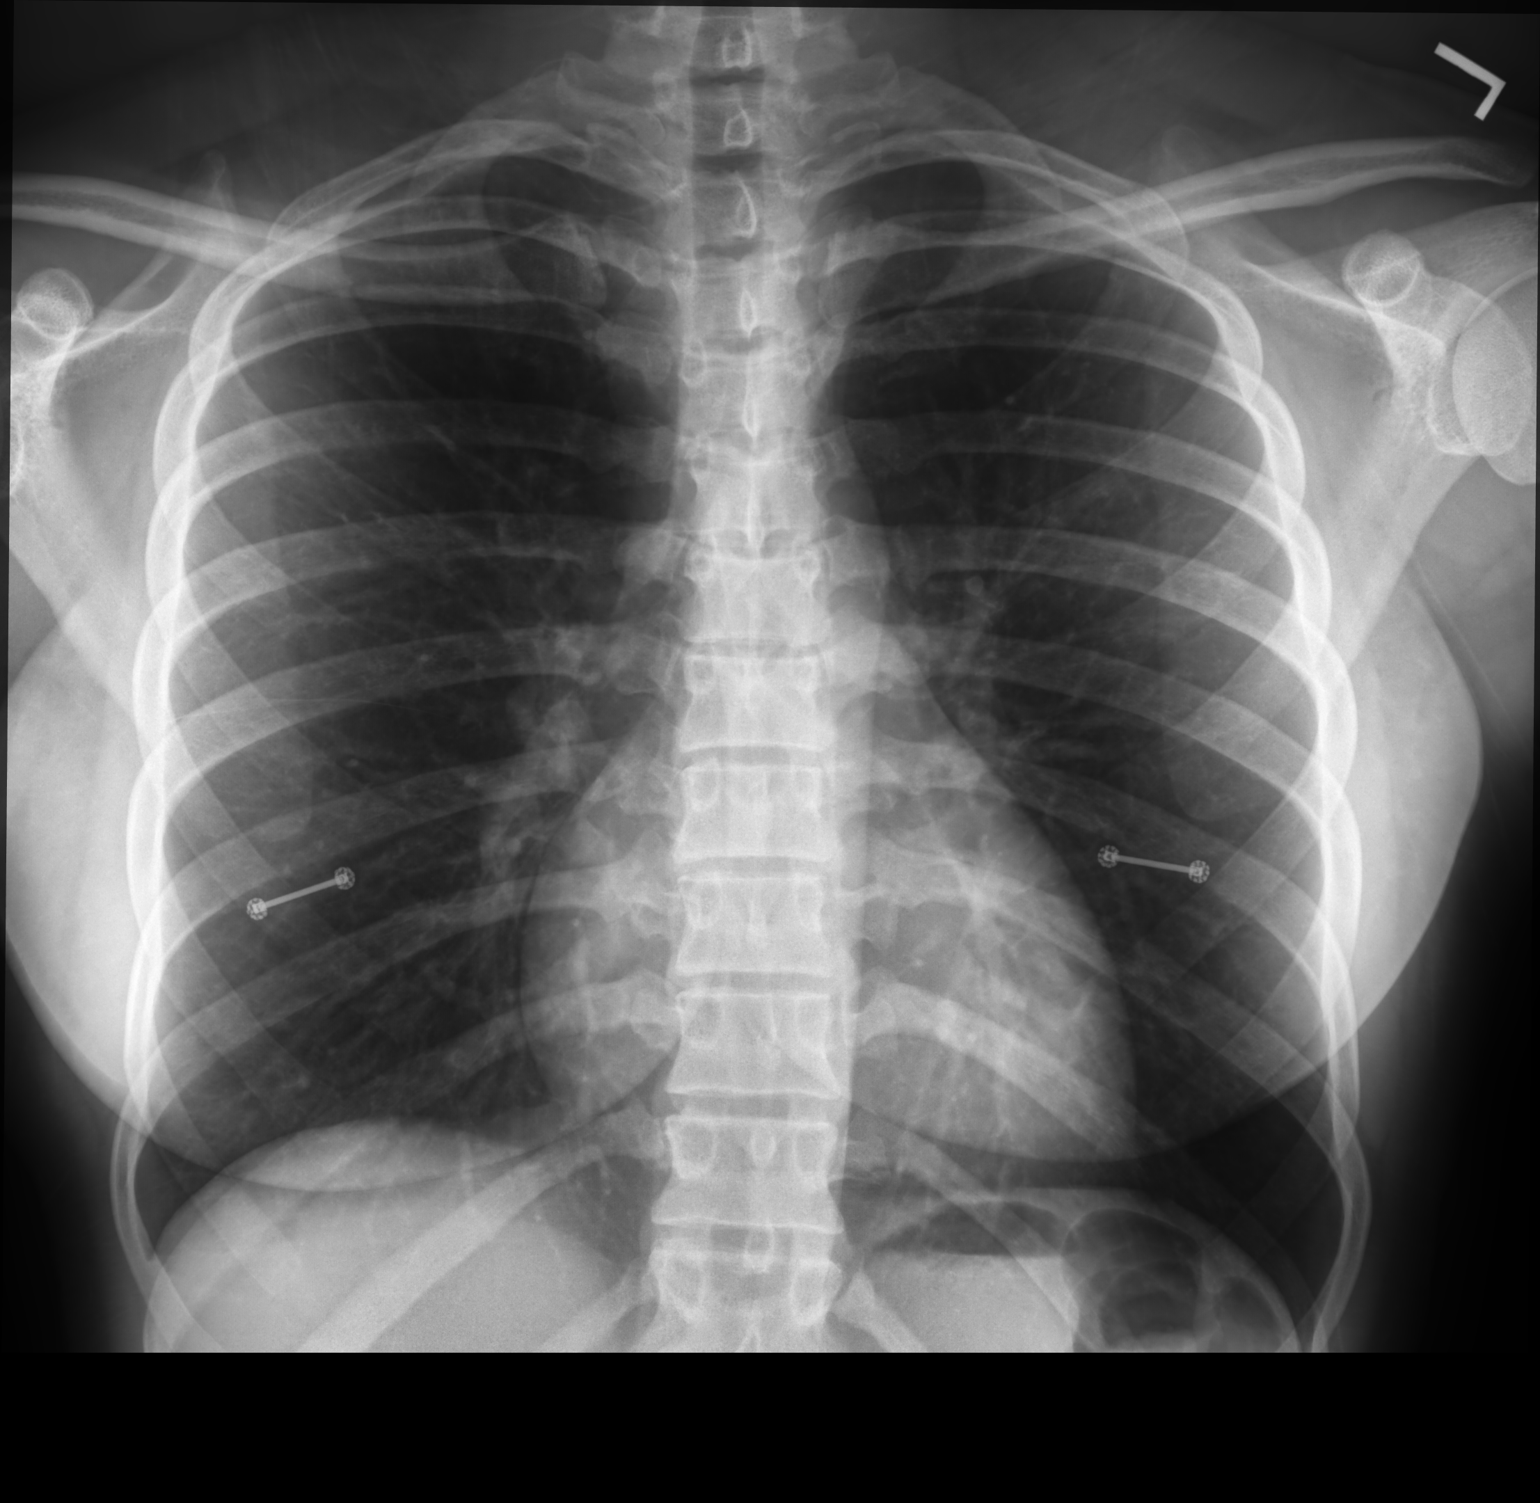

[2 of 2 positions shown; findings below may reference images not displayed]

FINDINGS: The cardiac silhouette, mediastinal and hilar contours are normal.
The lungs are clear. No pleural effusions. The bony thorax is
intact.
IMPRESSION: No acute cardiopulmonary findings.

## 2024-03-17 ENCOUNTER — Ambulatory Visit (HOSPITAL_COMMUNITY)

## 2024-03-21 ENCOUNTER — Ambulatory Visit (HOSPITAL_COMMUNITY)
Admission: RE | Admit: 2024-03-21 | Discharge: 2024-03-21 | Disposition: A | Discharge status: Home / Self Care | Payer: Self-pay | Source: Ambulatory Visit | Attending: Emergency Medicine | Admitting: Emergency Medicine

## 2024-03-21 ENCOUNTER — Encounter (HOSPITAL_COMMUNITY): Payer: Self-pay

## 2024-03-21 ENCOUNTER — Ambulatory Visit (INDEPENDENT_AMBULATORY_CARE_PROVIDER_SITE_OTHER): Payer: Self-pay

## 2024-03-21 VITALS — BP 142/84 | HR 56 | Temp 98.6°F | Resp 18

## 2024-03-21 DIAGNOSIS — R1084 Generalized abdominal pain: Secondary | ICD-10-CM | POA: Insufficient documentation

## 2024-03-21 DIAGNOSIS — K5900 Constipation, unspecified: Secondary | ICD-10-CM | POA: Insufficient documentation

## 2024-03-21 DIAGNOSIS — S40852A Superficial foreign body of left upper arm, initial encounter: Secondary | ICD-10-CM | POA: Insufficient documentation

## 2024-03-21 LAB — POCT URINALYSIS DIP (MANUAL ENTRY)
Bilirubin, UA: NEGATIVE
Glucose, UA: NEGATIVE mg/dL
Ketones, POC UA: NEGATIVE mg/dL
Leukocytes, UA: NEGATIVE
Nitrite, UA: NEGATIVE
Protein Ur, POC: NEGATIVE mg/dL
Spec Grav, UA: 1.02 (ref 1.010–1.025)
Urobilinogen, UA: 0.2 U/dL
pH, UA: 7.5 (ref 5.0–8.0)

## 2024-03-21 LAB — HIV ANTIBODY (ROUTINE TESTING W REFLEX): HIV Screen 4th Generation wRfx: NONREACTIVE

## 2024-03-21 LAB — POCT URINE PREGNANCY: Preg Test, Ur: NEGATIVE

## 2024-03-21 NOTE — Discharge Instructions (Signed)
 Based on the history that you provided to me today as well as my physical exam findings, I believe that you are suffering from constipation.  I recommend that you clear your bowels.    Please purchase 1 bottle of magnesium citrate at your pharmacy.  Drink one half of the bottle.  Within the next 4 hours, perhaps a little more or little less, you should feel significant urge to move your bowels and be able to produce a significant amount of stool.  If you feel that you have not had significant production of stool, please drink the second half of the bottle and wait another 4 hours.     If you are unable to produce a significant amount of stool, if clearing your bowels does not completely resolve your abdominal pain or if your abdominal pain becomes significantly worse, please go to the emergency room for further evaluation.  3D imaging, such as a CT scan or an ultrasound, may be indicated.  Thank you for visiting [facility] today.  We appreciate the opportunity to participate in your care.

## 2024-03-21 NOTE — ED Provider Notes (Incomplete)
 [facility]    CSN: [id number] Arrival date & time: 03/21/24  1548    HISTORY   Chief Complaint  Patient presents with   Vaginal Discharge    Starting to see brownish blood and discharge and starting to smell a little smell and was wondering what's going on with my body my stomach been hurting for 4Days now thought it was my period but I had that two weeks ago - Entered by patient   Abdominal Pain   HPI Kara Neal is a pleasant, 23 y.o. female who presents to urgent care today. Pt states she has brown discharge and lower abdominal cramping X 4 days. She states she is having some nausea and vomiting due to the abdominal cramping. She hasn't used any meds.  LMP 03/09/24, uses implant for contraception.    Vaginal Discharge Associated symptoms: abdominal pain   Abdominal Pain Associated symptoms: vaginal discharge    {Choices for HPI STD/UTI:30483}nexplanon Past Medical History:  Diagnosis Date   Asthma    There are no active problems to display for this patient.  History reviewed. No pertinent surgical history. OB History   No obstetric history on file.    Home Medications    Prior to Admission medications   Medication Sig Start Date End Date Taking? Authorizing Provider  albuterol  (PROVENTIL ) (2.5 MG/3ML) 0.083% nebulizer solution Take 3 mLs (2.5 mg total) by nebulization every 6 (six) hours as needed for wheezing or shortness of breath. 12/16/23   Lucina Sabal, PA-C    Family History Family History  Problem Relation Age of Onset   Asthma Father    Social History Social History   Tobacco Use   Smoking status: Never   Smokeless tobacco: Never  Vaping Use   Vaping status: Former  Substance Use Topics   Alcohol use: Not Currently    Comment: socially   Drug use: Not Currently    Types: Marijuana   Allergies   Bee pollen and Cat dander  Review of Systems Review of Systems  Gastrointestinal:  Positive for abdominal pain.  Genitourinary:   Positive for vaginal discharge.   Pertinent findings revealed after performing a 14 point review of systems has been noted in the history of present illness.  Physical Exam Vital Signs BP (!) 142/84 (BP Location: Left Arm)   Pulse (!) 56   Temp 98.6 F (37 C) (Oral)   Resp 18   LMP 03/09/2024 (Approximate)   SpO2 98%   No data found.  Physical Exam  Visual Acuity Right Eye Distance:   Left Eye Distance:   Bilateral Distance:    Right Eye Near:   Left Eye Near:    Bilateral Near:     UC Couse / Diagnostics / Procedures:     Radiology DG Abd 2 Views Result Date: 03/21/2024 CLINICAL DATA:  Generalized abdominal pain, nausea and vomiting. Brown discharge and lower abdominal cramping for 4 days. EXAM: ABDOMEN - 2 VIEW COMPARISON:  None Available. FINDINGS: Gas and stool throughout the colon. No small or large bowel distention. No abnormal air-fluid levels. No free intra-abdominal air. No radiopaque stones. Calcified phleboliths in the pelvis. Metallic piercings are present. Visualized bones and soft tissue contours appear intact. Lung bases are clear. IMPRESSION: Normal nonobstructive bowel gas pattern with scattered stool in the colon. Electronically Signed   By: Boyce Byes M.D.   On: 03/21/2024 17:10    Procedures Procedures (including critical care time) EKG  Pending results:  Labs Reviewed  POCT  URINALYSIS DIP (MANUAL ENTRY) - Abnormal; Notable for the following components:      Result Value   Blood, UA trace-intact (*)    All other components within normal limits  HIV ANTIBODY (ROUTINE TESTING W REFLEX)  POCT URINE PREGNANCY  CERVICOVAGINAL ANCILLARY ONLY    Medications Ordered in UC: Medications - No data to display  UC Diagnoses / Final Clinical Impressions(s)   I have reviewed the triage vital signs and the nursing notes.  Pertinent labs & imaging results that were available during my care of the patient were reviewed by me and considered in my medical  decision making (see chart for details).    Final diagnoses:  Generalized abdominal pain  Constipation, unspecified constipation type   {LMSTDP:27058}  {LMUTIP:27060}  Please see discharge instructions below for details of plan of care as provided to patient. ED Prescriptions   None    PDMP not reviewed this encounter.  Disposition Upon Discharge:  Condition: stable for discharge home  Patient presented with concern for an acute illness with associated systemic symptoms and significant discomfort requiring urgent management. In my opinion, this is a condition that a prudent lay person (someone who possesses an average knowledge of health and medicine) may potentially expect to result in complications if not addressed urgently such as respiratory distress, impairment of bodily function or dysfunction of bodily organs.   As such, the patient has been evaluated and assessed, work-up was performed and treatment was provided in alignment with urgent care protocols and evidence based medicine.  Patient/parent/caregiver has been advised that the patient may require follow up for further testing and/or treatment if the symptoms continue in spite of treatment, as clinically indicated and appropriate.  Routine symptom specific, illness specific and/or disease specific instructions were discussed with the patient and/or caregiver at length.  Prevention strategies for avoiding STD exposure were also discussed.  The patient will follow up with their current PCP if and as advised. If the patient does not currently have a PCP we will assist them in obtaining one.   The patient may need specialty follow up if the symptoms continue, in spite of conservative treatment and management, for further workup, evaluation, consultation and treatment as clinically indicated and appropriate.  Patient/parent/caregiver verbalized understanding and agreement of plan as discussed.  All questions were addressed during  visit.  Please see discharge instructions below for further details of plan.    Discharge Instructions      Based on the history that you provided to me today as well as my physical exam findings, I believe that you are suffering from constipation.  I recommend that you clear your bowels.    Please purchase 1 bottle of magnesium citrate at your pharmacy.  Drink one half of the bottle.  Within the next 4 hours, perhaps a little more or little less, you should feel significant urge to move your bowels and be able to produce a significant amount of stool.  If you feel that you have not had significant production of stool, please drink the second half of the bottle and wait another 4 hours.     If you are unable to produce a significant amount of stool, if clearing your bowels does not completely resolve your abdominal pain or if your abdominal pain becomes significantly worse, please go to the emergency room for further evaluation.  3D imaging, such as a CT scan or an ultrasound, may be indicated.  Thank you for visiting [facility]  today.  We appreciate the opportunity to participate in your care.       This office note has been dictated using Teaching laboratory technician.  Unfortunately, this method of dictation can sometimes lead to typographical or grammatical errors.  I apologize for your inconvenience in advance if this occurs.  Please do not hesitate to reach out to me if clarification is needed.

## 2024-03-21 NOTE — ED Triage Notes (Signed)
 Pt states she has brown discharge and lower abdominal cramping X 4 days. She states she is having some nausea and vomiting due to the abdominal cramping. She hasn't used any meds.

## 2024-03-22 ENCOUNTER — Encounter: Payer: Self-pay | Admitting: Emergency Medicine

## 2024-03-22 LAB — CERVICOVAGINAL ANCILLARY ONLY
Bacterial Vaginitis (gardnerella): NEGATIVE
Candida Glabrata: NEGATIVE
Candida Vaginitis: NEGATIVE
Chlamydia: NEGATIVE
Comment: NEGATIVE
Comment: NEGATIVE
Comment: NEGATIVE
Comment: NEGATIVE
Comment: NEGATIVE
Comment: NORMAL
Neisseria Gonorrhea: NEGATIVE
Trichomonas: NEGATIVE

## 2024-06-05 ENCOUNTER — Ambulatory Visit (HOSPITAL_COMMUNITY): Payer: Self-pay

## 2024-06-07 ENCOUNTER — Ambulatory Visit (HOSPITAL_COMMUNITY): Payer: Self-pay

## 2024-06-09 ENCOUNTER — Ambulatory Visit (HOSPITAL_COMMUNITY)
Admission: RE | Admit: 2024-06-09 | Discharge: 2024-06-09 | Disposition: A | Discharge status: Home / Self Care | Payer: Self-pay | Source: Ambulatory Visit

## 2024-06-09 ENCOUNTER — Encounter (HOSPITAL_COMMUNITY): Payer: Self-pay

## 2024-06-09 VITALS — BP 103/65 | HR 78 | Temp 98.3°F | Resp 14

## 2024-06-09 DIAGNOSIS — N760 Acute vaginitis: Secondary | ICD-10-CM | POA: Insufficient documentation

## 2024-06-09 DIAGNOSIS — Z3202 Encounter for pregnancy test, result negative: Secondary | ICD-10-CM

## 2024-06-09 DIAGNOSIS — M62830 Muscle spasm of back: Secondary | ICD-10-CM | POA: Insufficient documentation

## 2024-06-09 LAB — POCT URINE PREGNANCY: Preg Test, Ur: NEGATIVE

## 2024-06-09 MED ORDER — DICLOFENAC SODIUM 50 MG PO TBEC
50.0000 mg | DELAYED_RELEASE_TABLET | Freq: Two times a day (BID) | ORAL | 1 refills | Status: AC
Start: 2024-06-09 — End: ?

## 2024-06-09 MED ORDER — CYCLOBENZAPRINE HCL 10 MG PO TABS: 10.0000 mg | ORAL_TABLET | Freq: Two times a day (BID) | ORAL | 0 refills | Status: AC | PRN

## 2024-06-09 NOTE — ED Provider Notes (Signed)
 [facility]  Note:  This document was prepared using Dragon voice recognition software and may include unintentional dictation errors.  MRN: [id number] DOB: 2001-02-26  Subjective:   Kara Neal is a 23 y.o. female presenting for vaginal irritation x 2 months and new onset back pain x 1 week.  Patient reports she initially started having vaginal itching and irritation approximately 2 months ago.  Patient denies any sexual intercourse over the last 2 months.  Patient has not used any over-the-counter creams or ointments to treat symptoms.  No past history of BV or yeast infection.  Patient denies any discharge, dysuria, flank pain, increased urinary frequency, or abdominal pain.  Patient also reports about a week ago she noticed back pain over her whole back from neck all the way to tailbone, usually worse when laying down or resting.  Patient states that she works in a [employer] and often has to lift heavy boxes and move objects.  Patient is concerned she may have strained a muscle moving heavy boxes.  Patient denies taking any over-the-counter medication to treat symptoms  No current facility-administered medications for this encounter.  Current Outpatient Medications:    cyclobenzaprine (FLEXERIL) 10 MG tablet, Take 1 tablet (10 mg total) by mouth 2 (two) times daily as needed for muscle spasms., Disp: 20 tablet, Rfl: 0   diclofenac (VOLTAREN) 50 MG EC tablet, Take 1 tablet (50 mg total) by mouth 2 (two) times daily., Disp: 30 tablet, Rfl: 1   albuterol  (PROVENTIL ) (2.5 MG/3ML) 0.083% nebulizer solution, Take 3 mLs (2.5 mg total) by nebulization every 6 (six) hours as needed for wheezing or shortness of breath., Disp: 75 mL, Rfl: 12   Allergies  Allergen Reactions   Bee Pollen    Cat Dander     Past Medical History:  Diagnosis Date   Asthma      History reviewed. No pertinent surgical history.  Family History  Problem Relation Age of Onset   Asthma  Father     Social History   Tobacco Use   Smoking status: Never   Smokeless tobacco: Never  Vaping Use   Vaping status: Never Used  Substance Use Topics   Alcohol use: Yes    Comment: socially   Drug use: Yes    Types: Marijuana    ROS Refer to HPI for ROS details.  Objective:   Vitals: BP 103/65 (BP Location: Right Arm)   Pulse 78   Temp 98.3 F (36.8 C) (Oral)   Resp 14   SpO2 98%   Physical Exam Vitals and nursing note reviewed.  Constitutional:      General: She is not in acute distress.    Appearance: She is well-developed. She is not ill-appearing or toxic-appearing.  HENT:     Head: Normocephalic and atraumatic.  Cardiovascular:     Rate and Rhythm: Normal rate.  Pulmonary:     Effort: Pulmonary effort is normal. No respiratory distress.  Musculoskeletal:     Cervical back: Tenderness and bony tenderness present. No swelling, deformity, erythema, rigidity, spasms or crepitus. Pain with movement present. Normal range of motion.     Thoracic back: Tenderness and bony tenderness present. No swelling, signs of trauma or spasms. Normal range of motion.     Lumbar back: Tenderness and bony tenderness present. No swelling, deformity or spasms. Normal range of motion. Negative right straight leg raise test and negative left straight leg raise test.  Skin:    General: Skin is warm  and dry.  Neurological:     General: No focal deficit present.     Mental Status: She is alert and oriented to person, place, and time.  Psychiatric:        Mood and Affect: Mood normal.        Behavior: Behavior normal.     Procedures  Results for orders placed or performed during the hospital encounter of 06/09/24 (from the past 24 hours)  POCT urine pregnancy     Status: None   Collection Time: 06/09/24  4:10 PM  Result Value Ref Range   Preg Test, Ur Negative Negative    No results found.   Assessment and Plan :     Discharge Instructions       1. Muscle spasm of  back (Primary) - cyclobenzaprine (FLEXERIL) 10 MG tablet; Take 1 tablet (10 mg total) by mouth 2 (two) times daily as needed for muscle spasms.  Dispense: 20 tablet; Refill: 0 - diclofenac (VOLTAREN) 50 MG EC tablet; Take 1 tablet (50 mg total) by mouth 2 (two) times daily.  Dispense: 30 tablet; Refill: 1 - AMB referral to sports medicine for follow-up evaluation if symptoms do not improve for if symptoms worsen.  2. Acute vaginitis - POCT urine pregnancy completed UC is negative - Cervicovaginal swab collected in UC and sent to lab for further testing results should be available in 2 to 3 days.  If there are any abnormal findings on the final report you will be contacted and appropriate treatment provided. -Continue to monitor symptoms for any change in severity if there is any escalation of current symptoms or development of new symptoms follow-up in ER for further evaluation and management.      Dohn Stclair B Jasimine Simms   Pantelis Elgersma, [place] B, [place] 06/09/24 1644

## 2024-06-09 NOTE — ED Triage Notes (Signed)
 Patient c/o vaginal itching 2 months. Patient denies any vaginal discharge or sexual intercourse x 2 months.  Patient denies using any medication at home for vaginal itching.  Patient also c/o pain from her posterior neck to her tailbone x 1 week.Patient states she works at [employer] and does heavy lifting at work.

## 2024-06-09 NOTE — Discharge Instructions (Addendum)
  1. Muscle spasm of back (Primary) - cyclobenzaprine (FLEXERIL) 10 MG tablet; Take 1 tablet (10 mg total) by mouth 2 (two) times daily as needed for muscle spasms.  Dispense: 20 tablet; Refill: 0 - diclofenac (VOLTAREN) 50 MG EC tablet; Take 1 tablet (50 mg total) by mouth 2 (two) times daily.  Dispense: 30 tablet; Refill: 1 - AMB referral to sports medicine for follow-up evaluation if symptoms do not improve for if symptoms worsen.  2. Acute vaginitis - POCT urine pregnancy completed UC is negative - Cervicovaginal swab collected in UC and sent to lab for further testing results should be available in 2 to 3 days.  If there are any abnormal findings on the final report you will be contacted and appropriate treatment provided. -Continue to monitor symptoms for any change in severity if there is any escalation of current symptoms or development of new symptoms follow-up in ER for further evaluation and management.

## 2024-06-12 ENCOUNTER — Ambulatory Visit (HOSPITAL_COMMUNITY): Payer: Self-pay

## 2024-06-12 LAB — CERVICOVAGINAL ANCILLARY ONLY
Bacterial Vaginitis (gardnerella): NEGATIVE
Candida Glabrata: NEGATIVE
Candida Vaginitis: POSITIVE — AB
Chlamydia: NEGATIVE
Comment: NEGATIVE
Comment: NEGATIVE
Comment: NEGATIVE
Comment: NEGATIVE
Comment: NEGATIVE
Comment: NORMAL
Neisseria Gonorrhea: NEGATIVE
Trichomonas: NEGATIVE

## 2024-06-12 MED ORDER — FLUCONAZOLE 150 MG PO TABS: 150.0000 mg | ORAL_TABLET | Freq: Once | ORAL | 0 refills | Status: AC

## 2024-07-31 ENCOUNTER — Ambulatory Visit (HOSPITAL_COMMUNITY)
Admission: RE | Admit: 2024-07-31 | Discharge: 2024-07-31 | Disposition: A | Discharge status: Home / Self Care | Payer: Self-pay | Source: Ambulatory Visit | Attending: Emergency Medicine | Admitting: Emergency Medicine

## 2024-07-31 ENCOUNTER — Ambulatory Visit (INDEPENDENT_AMBULATORY_CARE_PROVIDER_SITE_OTHER): Payer: Self-pay

## 2024-07-31 ENCOUNTER — Encounter (HOSPITAL_COMMUNITY): Payer: Self-pay

## 2024-07-31 VITALS — BP 102/64 | HR 81 | Temp 98.3°F | Resp 18

## 2024-07-31 DIAGNOSIS — N898 Other specified noninflammatory disorders of vagina: Secondary | ICD-10-CM | POA: Insufficient documentation

## 2024-07-31 DIAGNOSIS — J4520 Mild intermittent asthma, uncomplicated: Secondary | ICD-10-CM | POA: Insufficient documentation

## 2024-07-31 DIAGNOSIS — B9789 Other viral agents as the cause of diseases classified elsewhere: Secondary | ICD-10-CM | POA: Insufficient documentation

## 2024-07-31 DIAGNOSIS — J988 Other specified respiratory disorders: Secondary | ICD-10-CM | POA: Insufficient documentation

## 2024-07-31 DIAGNOSIS — R051 Acute cough: Secondary | ICD-10-CM

## 2024-07-31 DIAGNOSIS — Z3202 Encounter for pregnancy test, result negative: Secondary | ICD-10-CM

## 2024-07-31 LAB — POCT URINE PREGNANCY: Preg Test, Ur: NEGATIVE

## 2024-07-31 LAB — HIV ANTIBODY (ROUTINE TESTING W REFLEX): HIV Screen 4th Generation wRfx: NONREACTIVE

## 2024-07-31 MED ORDER — PREDNISONE 20 MG PO TABS: 40.0000 mg | ORAL_TABLET | Freq: Every day | ORAL | 0 refills | Status: AC

## 2024-07-31 MED ORDER — PROMETHAZINE-DM 6.25-15 MG/5ML PO SYRP: 5.0000 mL | ORAL_SOLUTION | Freq: Every evening | ORAL | 0 refills | Status: AC | PRN

## 2024-07-31 MED ORDER — ALBUTEROL SULFATE HFA 108 (90 BASE) MCG/ACT IN AERS: 1.0000 | INHALATION_SPRAY | Freq: Four times a day (QID) | RESPIRATORY_TRACT | 0 refills | Status: AC | PRN

## 2024-07-31 MED ORDER — BENZONATATE 100 MG PO CAPS: 100.0000 mg | ORAL_CAPSULE | Freq: Three times a day (TID) | ORAL | 0 refills | Status: AC

## 2024-07-31 NOTE — ED Provider Notes (Addendum)
 [facility]    CSN: [id number] Arrival date & time: 07/31/24  1044      History   Chief Complaint Chief Complaint  Patient presents with   Cough   SEXUALLY TRANSMITTED DISEASE    HPI Kara Neal is a 23 y.o. female.   Patient presents with cough, congestion, sore throat, body aches, fatigue, and fever that began on 9/3.  Patient states that her symptoms have progressively worsened and she continues to have fever and bodyaches that keep her up at night.  Patient states that she now has some chest tightness as well.  Denies shortness of breath or severe chest pain.  Denies nausea, vomiting, and diarrhea.  Patient states that she does have a history of asthma, and states that she has had to use her albuterol  inhaler a few times as well.  Patient states that she is out of her albuterol  inhaler and is requesting a new one.  Patient is also requesting STD testing.  Patient states that she has had some on and off abnormal vaginal discharge since 9/3.  Patient denies any vaginal itching, pain, or lesions.  Patient also denies dysuria, hematuria, urinary frequency/urgency, and flank pain.  Patient states LMP was about a month ago.  Patient states that she does have a Nexplanon implant but it is old and needs to be replaced.  Patient reports that she is sexually active.  Patient denies any known exposures to STDs.  The history is provided by the patient and medical records.  Cough   Past Medical History:  Diagnosis Date   Asthma     There are no active problems to display for this patient.   History reviewed. No pertinent surgical history.  OB History   No obstetric history on file.      Home Medications    Prior to Admission medications   Medication Sig Start Date End Date Taking? Authorizing Provider  albuterol  (VENTOLIN  HFA) 108 (90 Base) MCG/ACT inhaler Inhale 1-2 puffs into the lungs every 6 (six) hours as needed for wheezing or shortness of breath. 07/31/24   Yes Johnie, Jaylia Pettus A, NP  benzonatate  (TESSALON ) 100 MG capsule Take 1 capsule (100 mg total) by mouth every 8 (eight) hours. 07/31/24  Yes Johnie, Cyndee Giammarco A, NP  predniSONE  (DELTASONE ) 20 MG tablet Take 2 tablets (40 mg total) by mouth daily for 5 days. 07/31/24 08/05/24 Yes Johnie Flaming A, NP  promethazine -dextromethorphan (PROMETHAZINE -DM) 6.25-15 MG/5ML syrup Take 5 mLs by mouth at bedtime as needed for cough. 07/31/24  Yes Johnie Flaming A, NP  albuterol  (PROVENTIL ) (2.5 MG/3ML) 0.083% nebulizer solution Take 3 mLs (2.5 mg total) by nebulization every 6 (six) hours as needed for wheezing or shortness of breath. 12/16/23   Hildegard, Amjad, PA-C  cyclobenzaprine  (FLEXERIL ) 10 MG tablet Take 1 tablet (10 mg total) by mouth 2 (two) times daily as needed for muscle spasms. 06/09/24   Reddick, Johnathan B, NP  diclofenac  (VOLTAREN ) 50 MG EC tablet Take 1 tablet (50 mg total) by mouth 2 (two) times daily. 06/09/24   Aurea Ethel NOVAK, NP    Family History Family History  Problem Relation Age of Onset   Asthma Father     Social History Social History   Tobacco Use   Smoking status: Never   Smokeless tobacco: Never  Vaping Use   Vaping status: Never Used  Substance Use Topics   Alcohol use: Yes    Comment: socially   Drug use: Yes    Types:  Marijuana     Allergies   Bee pollen and Cat dander   Review of Systems Review of Systems  Respiratory:  Positive for cough.    Per HPI  Physical Exam Triage Vital Signs ED Triage Vitals  Encounter Vitals Group     BP 07/31/24 1206 102/64     Girls Systolic BP Percentile --      Girls Diastolic BP Percentile --      Boys Systolic BP Percentile --      Boys Diastolic BP Percentile --      Pulse Rate 07/31/24 1206 81     Resp 07/31/24 1206 18     Temp 07/31/24 1206 98.3 F (36.8 C)     Temp Source 07/31/24 1206 Oral     SpO2 07/31/24 1206 98 %     Weight --      Height --      Head Circumference --      Peak Flow --      Pain  Score 07/31/24 1207 8     Pain Loc --      Pain Education --      Exclude from Growth Chart --    No data found.  Updated Vital Signs BP 102/64 (BP Location: Left Arm)   Pulse 81   Temp 98.3 F (36.8 C) (Oral)   Resp 18   SpO2 98%   Visual Acuity Right Eye Distance:   Left Eye Distance:   Bilateral Distance:    Right Eye Near:   Left Eye Near:    Bilateral Near:     Physical Exam Vitals and nursing note reviewed.  Constitutional:      General: She is awake. She is not in acute distress.    Appearance: Normal appearance. She is well-developed and well-groomed. She is ill-appearing. She is not toxic-appearing or diaphoretic.  HENT:     Right Ear: Tympanic membrane, ear canal and external ear normal.     Left Ear: Tympanic membrane, ear canal and external ear normal.     Nose: Congestion and rhinorrhea present.     Mouth/Throat:     Mouth: Mucous membranes are moist.     Pharynx: Posterior oropharyngeal erythema and postnasal drip present. No oropharyngeal exudate.  Cardiovascular:     Rate and Rhythm: Normal rate and regular rhythm.  Pulmonary:     Effort: Pulmonary effort is normal.     Breath sounds: Normal breath sounds.  Abdominal:     General: Abdomen is flat. Bowel sounds are normal. There is no distension.     Palpations: Abdomen is soft.     Tenderness: There is no abdominal tenderness. There is no right CVA tenderness, left CVA tenderness, guarding or rebound.  Genitourinary:    Comments: Exam deferred Skin:    General: Skin is warm and dry.  Neurological:     Mental Status: She is alert.  Psychiatric:        Behavior: Behavior is cooperative.      UC Treatments / Results  Labs (all labs ordered are listed, but only abnormal results are displayed) Labs Reviewed  HIV ANTIBODY (ROUTINE TESTING W REFLEX)  POCT URINE PREGNANCY  CERVICOVAGINAL ANCILLARY ONLY    EKG   Radiology DG Chest 2 View Result Date: 07/31/2024 CLINICAL DATA:  Cough.  Chest  tightness. EXAM: CHEST - 2 VIEW COMPARISON:  Radiographs 12/16/2023 and 03/11/2023. FINDINGS: The heart size and mediastinal contours are normal. The lungs are clear. There is no  pleural effusion or pneumothorax. No acute osseous findings are identified. IMPRESSION: No active cardiopulmonary process. Electronically Signed   By: Elsie Perone M.D.   On: 07/31/2024 13:22    Procedures Procedures (including critical care time)  Medications Ordered in UC Medications - No data to display  Initial Impression / Assessment and Plan / UC Course  I have reviewed the triage vital signs and the nursing notes.  Pertinent labs & imaging results that were available during my care of the patient were reviewed by me and considered in my medical decision making (see chart for details).     Patient is overall mildly ill-appearing.  Congestion and rhinorrhea are present, erythema and PND noted to posterior oropharynx.  Lungs clear bilaterally to auscultation.  Chest x-ray ordered due to presence of chest tightness with cough.  Based on my interpretation there is no active cardiopulmonary disease.  Radiology report confirms this.  Symptoms likely viral in nature and have mildly exacerbated asthma.  Prescribed prednisone  burst to assist with this.  Prescribed Tessalon  and Promethazine  DM as needed for cough.  Refilled albuterol  inhaler.  Discussed over-the-counter medications as needed for symptoms.  GU exam deferred.  Patient perform self swab for STD/STI.  HIV and RPR ordered.  Discussed follow-up and return precautions. Final Clinical Impressions(s) / UC Diagnoses   Final diagnoses:  Vaginal discharge  Acute cough  Viral respiratory illness  Mild intermittent asthma without complication     Discharge Instructions      Your chest x-ray was negative for any underlying pneumonia.  I believe her symptoms are likely viral in nature. Take 2 tablets of prednisone  once daily for 5 days to assist with  inflammation likely mildly exacerbating your asthma and this will assist with sinus pressure. You can take Tessalon  every 8 hours as needed for cough. You can take Promethazine  DM cough syrup at bedtime as needed for cough.  This does cause drowsiness so do not drive, work, or drink alcohol while taking this. You can continue taking DayQuil throughout the day to help with your symptoms.  You can also take over-the-counter Mucinex for cough and congestion as well. Make sure you are staying hydrated and getting plenty of rest.  Your swab and blood work results will return over the next few days and someone will call if results are positive or require any additional treatment.  Follow-up with your primary care provider or return here as needed.     ED Prescriptions     Medication Sig Dispense Auth. Provider   benzonatate  (TESSALON ) 100 MG capsule Take 1 capsule (100 mg total) by mouth every 8 (eight) hours. 21 capsule Johnie Flaming A, NP   promethazine -dextromethorphan (PROMETHAZINE -DM) 6.25-15 MG/5ML syrup Take 5 mLs by mouth at bedtime as needed for cough. 118 mL Johnie Flaming A, NP   predniSONE  (DELTASONE ) 20 MG tablet Take 2 tablets (40 mg total) by mouth daily for 5 days. 10 tablet Johnie Flaming A, NP   albuterol  (VENTOLIN  HFA) 108 (90 Base) MCG/ACT inhaler Inhale 1-2 puffs into the lungs every 6 (six) hours as needed for wheezing or shortness of breath. 8 g Johnie Flaming A, NP      PDMP not reviewed this encounter.   Johnie Flaming LABOR, NP 07/31/24 1336    Johnie Flaming A, NP 07/31/24 [phone]

## 2024-07-31 NOTE — Discharge Instructions (Signed)
 Your chest x-ray was negative for any underlying pneumonia.  I believe her symptoms are likely viral in nature. Take 2 tablets of prednisone  once daily for 5 days to assist with inflammation likely mildly exacerbating your asthma and this will assist with sinus pressure. You can take Tessalon  every 8 hours as needed for cough. You can take Promethazine  DM cough syrup at bedtime as needed for cough.  This does cause drowsiness so do not drive, work, or drink alcohol while taking this. You can continue taking DayQuil throughout the day to help with your symptoms.  You can also take over-the-counter Mucinex for cough and congestion as well. Make sure you are staying hydrated and getting plenty of rest.  Your swab and blood work results will return over the next few days and someone will call if results are positive or require any additional treatment.  Follow-up with your primary care provider or return here as needed.

## 2024-07-31 NOTE — ED Triage Notes (Signed)
 Pt c/o cough, sore throat, nasal/chest congestion, fatigue, fever, and body aches x5 days. States has taken multiple OTC meds with no relief.   Pt requesting STD testing. Denies sx's.

## 2024-08-01 ENCOUNTER — Ambulatory Visit (HOSPITAL_COMMUNITY): Payer: Self-pay

## 2024-08-01 LAB — CERVICOVAGINAL ANCILLARY ONLY
Bacterial Vaginitis (gardnerella): POSITIVE — AB
Candida Glabrata: NEGATIVE
Candida Vaginitis: NEGATIVE
Chlamydia: NEGATIVE
Comment: NEGATIVE
Comment: NEGATIVE
Comment: NEGATIVE
Comment: NEGATIVE
Comment: NEGATIVE
Comment: NORMAL
Neisseria Gonorrhea: NEGATIVE
Trichomonas: NEGATIVE

## 2024-08-01 MED ORDER — METRONIDAZOLE 500 MG PO TABS: 500.0000 mg | ORAL_TABLET | Freq: Two times a day (BID) | ORAL | 0 refills | Status: AC

## 2024-09-23 ENCOUNTER — Telehealth: Payer: Self-pay | Admitting: Family Medicine

## 2024-09-23 DIAGNOSIS — Z7689 Persons encountering health services in other specified circumstances: Secondary | ICD-10-CM

## 2024-09-23 NOTE — Progress Notes (Signed)
 Pt needs a pcp for an annual wellness physical. Advised on how to go to [facility] website to schedule this. DWB

## 2024-09-25 ENCOUNTER — Encounter: Payer: Self-pay | Admitting: Radiology
